# Patient Record
Sex: Male | Born: 1955 | Race: White | Hispanic: No | Marital: Married | Smoking: Never smoker
Health system: Southern US, Community
[De-identification: ages and names within clinical notes are randomized; demographics above are authoritative.]

## PROBLEM LIST (undated history)

## (undated) DIAGNOSIS — Z87442 Personal history of urinary calculi: Secondary | ICD-10-CM

## (undated) DIAGNOSIS — M1990 Unspecified osteoarthritis, unspecified site: Secondary | ICD-10-CM

## (undated) DIAGNOSIS — G4730 Sleep apnea, unspecified: Secondary | ICD-10-CM

## (undated) DIAGNOSIS — I499 Cardiac arrhythmia, unspecified: Secondary | ICD-10-CM

## (undated) DIAGNOSIS — C801 Malignant (primary) neoplasm, unspecified: Secondary | ICD-10-CM

## (undated) DIAGNOSIS — R202 Paresthesia of skin: Secondary | ICD-10-CM

## (undated) DIAGNOSIS — R253 Fasciculation: Secondary | ICD-10-CM

## (undated) DIAGNOSIS — I10 Essential (primary) hypertension: Secondary | ICD-10-CM

## (undated) HISTORY — PX: CARDIAC CATHETERIZATION: SHX172

## (undated) HISTORY — DX: Paresthesia of skin: R20.2

## (undated) HISTORY — PX: LUMBAR LAMINECTOMY: SHX95

## (undated) HISTORY — PX: COLONOSCOPY: SHX174

## (undated) HISTORY — PX: HERNIA REPAIR: SHX51

## (undated) HISTORY — PX: NASAL SINUS SURGERY: SHX719

## (undated) HISTORY — DX: Fasciculation: R25.3

---

## 2018-10-14 ENCOUNTER — Other Ambulatory Visit: Payer: Self-pay | Admitting: Neurological Surgery

## 2018-10-27 NOTE — Pre-Procedure Instructions (Signed)
Lamare Antes Mckinnon  10/27/2018    Your procedure is scheduled on Friday, October 31, 2018 at 10:30 AM.   Report to [facility] Entrance "A" Admitting Office at 8:30 AM.   Call this number if you have problems the morning of surgery: [phone]   Questions prior to day of surgery, please call [phone] between 8 & 4 PM.   Remember:  Do not eat or drink after midnight Thursday, 10/30/18.  Take these medicines the morning of surgery with A SIP OF WATER: Amlodipine (Norvasc), Atorvastatin (Lipitor)  Stop Aspirin as instructed by cardiologist/surgeon. Stop Fish Oil, Co Q 10 and Glucosamine as of today prior to surgeryl    Do not wear jewelry.  Do not wear lotions, powders, cologne or deodorant.  Men may shave face and neck.  Do not bring valuables to the hospital.  [facility] is not responsible for any belongings or valuables.  Contacts, dentures or bridgework may not be worn into surgery.  Leave your suitcase in the car.  After surgery it may be brought to your room.  For patients admitted to the hospital, discharge time will be determined by your treatment team.  [facility] - Preparing for Surgery  Before surgery, you can play an important role.  Because skin is not sterile, your skin needs to be as free of germs as possible.  You can reduce the number of germs on you skin by washing with CHG (chlorahexidine gluconate) soap before surgery.  CHG is an antiseptic cleaner which kills germs and bonds with the skin to continue killing germs even after washing.  Oral Hygiene is also important in reducing the risk of infection.  Remember to brush your teeth with your regular toothpaste the morning of surgery.  Please DO NOT use if you have an allergy to CHG or antibacterial soaps.  If your skin becomes reddened/irritated stop using the CHG and inform your nurse when you arrive at Short Stay.  Do not shave (including legs and underarms) for at least 48 hours prior to the first  CHG shower.  You may shave your face.  Please follow these instructions carefully:   1.  Shower with CHG Soap the night before surgery and the morning of Surgery.  2.  If you choose to wash your hair, wash your hair first as usual with your normal shampoo.  3.  After you shampoo, rinse your hair and body thoroughly to remove the shampoo. 4.  Use CHG as you would any other liquid soap.  You can apply chg directly to the skin and wash gently with a      scrungie or washcloth.           5.  Apply the CHG Soap to your body ONLY FROM THE NECK DOWN.   Do not use on open wounds or open sores. Avoid contact with your eyes, ears, mouth and genitals (private parts).  Wash genitals (private parts) with your normal soap - do this prior to using CHG soap.  6.  Wash thoroughly, paying special attention to the area where your surgery will be performed.  7.  Thoroughly rinse your body with warm water from the neck down.  8.  DO NOT shower/wash with your normal soap after using and rinsing off the CHG Soap.  9.  Pat yourself dry with a clean towel.            10.  Wear clean pajamas.  11.  Place clean sheets on your bed the night of your first shower and do not sleep with pets.  Day of Surgery  Shower as above. Do not apply any lotions/deodorants the morning of surgery.   Please wear clean clothes to the hospital. Remember to brush your teeth with toothpaste.   Please read over the fact sheets that you were given.

## 2018-10-28 ENCOUNTER — Other Ambulatory Visit: Payer: Self-pay

## 2018-10-28 ENCOUNTER — Encounter (HOSPITAL_COMMUNITY): Payer: Self-pay

## 2018-10-28 ENCOUNTER — Encounter (HOSPITAL_COMMUNITY)
Admission: RE | Admit: 2018-10-28 | Discharge: 2018-10-28 | Disposition: A | Discharge status: Home / Self Care | Payer: BC Managed Care – PPO | Source: Ambulatory Visit | Attending: Neurological Surgery | Admitting: Neurological Surgery

## 2018-10-28 ENCOUNTER — Other Ambulatory Visit (HOSPITAL_COMMUNITY)
Admission: RE | Admit: 2018-10-28 | Discharge: 2018-10-28 | Disposition: A | Discharge status: Home / Self Care | Payer: BC Managed Care – PPO | Source: Ambulatory Visit | Attending: Neurological Surgery | Admitting: Neurological Surgery

## 2018-10-28 ENCOUNTER — Ambulatory Visit (HOSPITAL_COMMUNITY)
Admission: RE | Admit: 2018-10-28 | Discharge: 2018-10-28 | Disposition: A | Discharge status: Home / Self Care | Payer: BC Managed Care – PPO | Source: Ambulatory Visit | Attending: Neurological Surgery | Admitting: Neurological Surgery

## 2018-10-28 DIAGNOSIS — M4316 Spondylolisthesis, lumbar region: Secondary | ICD-10-CM | POA: Insufficient documentation

## 2018-10-28 DIAGNOSIS — Z20828 Contact with and (suspected) exposure to other viral communicable diseases: Secondary | ICD-10-CM | POA: Insufficient documentation

## 2018-10-28 DIAGNOSIS — M4310 Spondylolisthesis, site unspecified: Secondary | ICD-10-CM

## 2018-10-28 DIAGNOSIS — Z01818 Encounter for other preprocedural examination: Secondary | ICD-10-CM | POA: Diagnosis not present

## 2018-10-28 HISTORY — DX: Unspecified osteoarthritis, unspecified site: M19.90

## 2018-10-28 HISTORY — DX: Malignant (primary) neoplasm, unspecified: C80.1

## 2018-10-28 HISTORY — DX: Essential (primary) hypertension: I10

## 2018-10-28 HISTORY — DX: Cardiac arrhythmia, unspecified: I49.9

## 2018-10-28 HISTORY — DX: Sleep apnea, unspecified: G47.30

## 2018-10-28 HISTORY — DX: Personal history of urinary calculi: Z87.442

## 2018-10-28 LAB — TYPE AND SCREEN
ABO/RH(D): O NEG
Antibody Screen: NEGATIVE

## 2018-10-28 LAB — BASIC METABOLIC PANEL
Anion gap: 10 (ref 5–15)
BUN: 9 mg/dL (ref 8–23)
CO2: 25 mmol/L (ref 22–32)
Calcium: 9.5 mg/dL (ref 8.9–10.3)
Chloride: 104 mmol/L (ref 98–111)
Creatinine, Ser: 1.15 mg/dL (ref 0.61–1.24)
GFR calc Af Amer: >60 mL/min (ref >60)
GFR calc non Af Amer: >60 mL/min (ref >60)
Glucose, Bld: 131 mg/dL — ABNORMAL HIGH (ref 70–99)
Potassium: 4.1 mmol/L (ref 3.5–5.1)
Sodium: 139 mmol/L (ref 135–145)

## 2018-10-28 LAB — CBC WITH DIFFERENTIAL/PLATELET
Abs Immature Granulocytes: 0.02 10*3/uL (ref 0.00–0.07)
Basophils Absolute: 0 10*3/uL (ref 0.0–0.1)
Basophils Relative: 0 %
Eosinophils Absolute: 0 10*3/uL (ref 0.0–0.5)
Eosinophils Relative: 0 %
HCT: 49.3 % (ref 39.0–52.0)
Hemoglobin: 17.4 g/dL — ABNORMAL HIGH (ref 13.0–17.0)
Immature Granulocytes: 0 %
Lymphocytes Relative: 16 %
Lymphs Abs: 1.3 10*3/uL (ref 0.7–4.0)
MCH: 31.1 pg (ref 26.0–34.0)
MCHC: 35.3 g/dL (ref 30.0–36.0)
MCV: 88.2 fL (ref 80.0–100.0)
Monocytes Absolute: 0.3 10*3/uL (ref 0.1–1.0)
Monocytes Relative: 4 %
Neutro Abs: 6.3 10*3/uL (ref 1.7–7.7)
Neutrophils Relative %: 80 %
Platelets: 208 10*3/uL (ref 150–400)
RBC: 5.59 MIL/uL (ref 4.22–5.81)
RDW: 12.1 % (ref 11.5–15.5)
WBC: 8 10*3/uL (ref 4.0–10.5)
nRBC: 0 % (ref 0.0–0.2)

## 2018-10-28 LAB — PROTIME-INR
INR: 1.1 (ref 0.8–1.2)
Prothrombin Time: 13.9 seconds (ref 11.4–15.2)

## 2018-10-28 LAB — SURGICAL PCR SCREEN
MRSA, PCR: NEGATIVE
Staphylococcus aureus: NEGATIVE

## 2018-10-28 LAB — ABO/RH: ABO/RH(D): O NEG

## 2018-10-28 NOTE — Progress Notes (Signed)
PCP - Dr. Allie Dimmer Cardiologist - Dr. Lorella Nimrod  PPM/ICD - n/a Device Orders -  Rep Notified  Chest x-ray - n/a EKG - requested from [place] - 04/02/18 (Care Everywhere) ECHO -  Cardiac Cath - 04/02/18 (Care Everywhere)  Sleep Study -  CPAP - yes, will bring DOS  Fasting Blood Sugar - n/a Checks Blood Sugar _____ times a day  Blood Thinner Instructions: n/a Aspirin Instructions: last dose 10/21/18  ERAS Protcol - n/a PRE-SURGERY Ensure -   COVID TEST- going today after this appt.   Anesthesia review: yes, to make sure EKG arrives  Patient denies shortness of breath, fever, cough and chest pain at PAT appointment   Patient verbalized understanding of instructions that were given to them at the PAT appointment. Patient was also instructed that they will need to review over the PAT instructions again at home before surgery.  Pt given quarantine instructions and informed of visitation policy and voiced understanding of both.

## 2018-10-29 LAB — NOVEL CORONAVIRUS, NAA (HOSP ORDER, SEND-OUT TO REF LAB; TAT 18-24 HRS): SARS-CoV-2, NAA: NOT DETECTED

## 2018-10-29 NOTE — Anesthesia Preprocedure Evaluation (Addendum)
Anesthesia Evaluation  Patient identified by MRN, date of birth, ID band Patient awake    Reviewed: Allergy & Precautions, NPO status , Patient's Chart, lab work & pertinent test results  Airway Mallampati: I  TM Distance: >3 FB Neck ROM: Full    Dental  (+) Teeth Intact, Dental Advisory Given   Pulmonary sleep apnea and Continuous Positive Airway Pressure Ventilation ,    breath sounds clear to auscultation       Cardiovascular hypertension, Pt. on medications + dysrhythmias  Rhythm:Regular Rate:Normal     Neuro/Psych negative neurological ROS  negative psych ROS   GI/Hepatic Neg liver ROS, GERD  Medicated,  Endo/Other  negative endocrine ROS  Renal/GU negative Renal ROS     Musculoskeletal  (+) Arthritis ,   Abdominal Normal abdominal exam  (+)   Peds  Hematology negative hematology ROS (+)   Anesthesia Other Findings   Reproductive/Obstetrics                           Anesthesia Physical Anesthesia Plan  ASA: II  Anesthesia Plan: General   Post-op Pain Management:    Induction: Intravenous  PONV Risk Score and Plan: 3 and Ondansetron, Dexamethasone and Midazolam  Airway Management Planned: Oral ETT  Additional Equipment: None  Intra-op Plan:   Post-operative Plan: Extubation in OR  Informed Consent: I have reviewed the patients History and Physical, chart, labs and discussed the procedure including the risks, benefits and alternatives for the proposed anesthesia with the patient or authorized representative who has indicated his/her understanding and acceptance.     Dental advisory given  Plan Discussed with: CRNA  Anesthesia Plan Comments: (Follows with cardiology at [facility] clinic for hx of Mobitz I block and management of HTN, HLD, OSA. He had an abnormal stress echo 3/20 and subsequently underwent cath showing only mild diffuse CAD. He also had a cardiac MRI to  evaluate enlarged RV - scan showed showed left and right ventricular systolic function are within normal range. No significant heart valve disease. He was seen 09/29/18 for preop clearance. Per note "Preoperative exam for non cardiac (upcoming back) surgery: May proceed with surgical procedure without further cardiac testing, benefits out weigh risks. No active major cardiac disease (recent MI, decompensated CHF, uncontrolled arrrhythmias, or severe valvular disease). Continue current cardiovascular medication, and per-operative monitoring.  EKG 04/01/18 (care everywhere): Sinus rhythm with 1st degree A-V block. Rate 64.  Tracing requested x2 via fax and phone, not yet received. Will need EKG DOS if not received.   Cardiac MRI 06/20/18 (care everywhere): Impression:  1.Hyperdynamic left ventricular systolic function. Left ventricular ejection fraction is 71%.  2. Normal right ventricular systolic function. Right ventricular EF 52%.  3. There is no late gadolinium enhancement  4. There is noevidence of acute inflammation/edema. No evidence of myocardial iron overload.  5. There is no evidence of intracavitary thrombus  6. Normal coronary artery origin  7. There is no significant pericardial effusion  8. No significant valve disease   Cath 04/02/18 (care everywhere): 1. Diffuse mild atherosclerotic disease of the epicardial coronary  arteries with a very large caliber & diffusely ectatic dominant RCA  2. Normal LVEDP (9 mmHg)  3. Mild gradient across aortic valve (7 mmHg))     Anesthesia Quick Evaluation

## 2018-10-31 ENCOUNTER — Encounter (HOSPITAL_COMMUNITY)
Admission: RE | Disposition: A | Discharge status: Home / Self Care | Payer: Self-pay | Source: Home / Self Care | Attending: Neurological Surgery

## 2018-10-31 ENCOUNTER — Other Ambulatory Visit: Payer: Self-pay

## 2018-10-31 ENCOUNTER — Inpatient Hospital Stay (HOSPITAL_COMMUNITY): Payer: BC Managed Care – PPO

## 2018-10-31 ENCOUNTER — Inpatient Hospital Stay (HOSPITAL_COMMUNITY)
Admission: RE | Admit: 2018-10-31 | Discharge: 2018-11-01 | DRG: 454 | Disposition: A | Discharge status: Home / Self Care | Payer: BC Managed Care – PPO | Attending: Neurological Surgery | Admitting: Neurological Surgery

## 2018-10-31 ENCOUNTER — Inpatient Hospital Stay (HOSPITAL_COMMUNITY): Payer: BC Managed Care – PPO | Admitting: Certified Registered Nurse Anesthetist

## 2018-10-31 ENCOUNTER — Inpatient Hospital Stay (HOSPITAL_COMMUNITY): Payer: BC Managed Care – PPO | Admitting: Physician Assistant

## 2018-10-31 ENCOUNTER — Encounter (HOSPITAL_COMMUNITY): Payer: Self-pay | Admitting: *Deleted

## 2018-10-31 DIAGNOSIS — M4316 Spondylolisthesis, lumbar region: Secondary | ICD-10-CM | POA: Diagnosis present

## 2018-10-31 DIAGNOSIS — I10 Essential (primary) hypertension: Secondary | ICD-10-CM | POA: Diagnosis present

## 2018-10-31 DIAGNOSIS — M48061 Spinal stenosis, lumbar region without neurogenic claudication: Secondary | ICD-10-CM | POA: Diagnosis present

## 2018-10-31 DIAGNOSIS — Y838 Other surgical procedures as the cause of abnormal reaction of the patient, or of later complication, without mention of misadventure at the time of the procedure: Secondary | ICD-10-CM | POA: Diagnosis not present

## 2018-10-31 DIAGNOSIS — Z85828 Personal history of other malignant neoplasm of skin: Secondary | ICD-10-CM | POA: Diagnosis not present

## 2018-10-31 DIAGNOSIS — M1990 Unspecified osteoarthritis, unspecified site: Secondary | ICD-10-CM | POA: Diagnosis present

## 2018-10-31 DIAGNOSIS — Z7982 Long term (current) use of aspirin: Secondary | ICD-10-CM | POA: Diagnosis not present

## 2018-10-31 DIAGNOSIS — G9741 Accidental puncture or laceration of dura during a procedure: Secondary | ICD-10-CM | POA: Diagnosis not present

## 2018-10-31 DIAGNOSIS — K219 Gastro-esophageal reflux disease without esophagitis: Secondary | ICD-10-CM | POA: Diagnosis present

## 2018-10-31 DIAGNOSIS — G4730 Sleep apnea, unspecified: Secondary | ICD-10-CM | POA: Diagnosis present

## 2018-10-31 DIAGNOSIS — Z981 Arthrodesis status: Secondary | ICD-10-CM

## 2018-10-31 DIAGNOSIS — Z419 Encounter for procedure for purposes other than remedying health state, unspecified: Secondary | ICD-10-CM

## 2018-10-31 HISTORY — PX: LUMBAR LAMINECTOMY/DECOMPRESSION MICRODISCECTOMY: SHX5026

## 2018-10-31 SURGERY — POSTERIOR LUMBAR FUSION 1 LEVEL
Anesthesia: General | Site: Back

## 2018-10-31 MED ORDER — SODIUM CHLORIDE (PF) 0.9 % IJ SOLN
INTRAMUSCULAR | Status: DC | PRN
  Administered 2018-10-31: 5 mL

## 2018-10-31 MED ORDER — ACETAMINOPHEN 10 MG/ML IV SOLN
INTRAVENOUS | Status: AC
  Filled 2018-10-31: qty 100

## 2018-10-31 MED ORDER — MIDAZOLAM HCL 2 MG/2ML IJ SOLN
INTRAMUSCULAR | Status: AC
  Filled 2018-10-31: qty 2

## 2018-10-31 MED ORDER — THROMBIN 20000 UNITS EX SOLR
CUTANEOUS | Status: DC | PRN
  Administered 2018-10-31: 20 mL

## 2018-10-31 MED ORDER — CHLORHEXIDINE GLUCONATE CLOTH 2 % EX PADS: 6.0000 | MEDICATED_PAD | Freq: Once | CUTANEOUS | Status: DC

## 2018-10-31 MED ORDER — CEFAZOLIN SODIUM-DEXTROSE 2-4 GM/100ML-% IV SOLN
2.0000 g | INTRAVENOUS | Status: AC
  Administered 2018-10-31: 11:00:00 2 g via INTRAVENOUS

## 2018-10-31 MED ORDER — FENTANYL CITRATE (PF) 250 MCG/5ML IJ SOLN
INTRAMUSCULAR | Status: AC
  Filled 2018-10-31: qty 5

## 2018-10-31 MED ORDER — GLYCOPYRROLATE 0.2 MG/ML IJ SOLN
INTRAMUSCULAR | Status: DC | PRN
  Administered 2018-10-31: 0.2 mg via INTRAVENOUS

## 2018-10-31 MED ORDER — KETOROLAC TROMETHAMINE 30 MG/ML IJ SOLN
INTRAMUSCULAR | Status: AC
  Filled 2018-10-31: qty 1

## 2018-10-31 MED ORDER — METHOCARBAMOL 500 MG PO TABS: 500.0000 mg | ORAL_TABLET | Freq: Four times a day (QID) | ORAL | Status: DC | PRN

## 2018-10-31 MED ORDER — ONDANSETRON HCL 4 MG PO TABS: 4.0000 mg | ORAL_TABLET | Freq: Four times a day (QID) | ORAL | Status: DC | PRN

## 2018-10-31 MED ORDER — OXYCODONE HCL 5 MG PO TABS
10.0000 mg | ORAL_TABLET | ORAL | Status: DC | PRN
  Administered 2018-10-31: 10 mg via ORAL
  Filled 2018-10-31 (×2): qty 2

## 2018-10-31 MED ORDER — PROPOFOL 500 MG/50ML IV EMUL
INTRAVENOUS | Status: DC | PRN
  Administered 2018-10-31: 50 ug/kg/min via INTRAVENOUS

## 2018-10-31 MED ORDER — HYDROMORPHONE HCL 1 MG/ML IJ SOLN
0.2500 mg | INTRAMUSCULAR | Status: DC | PRN
  Administered 2018-10-31 (×2): 0.5 mg via INTRAVENOUS

## 2018-10-31 MED ORDER — MORPHINE SULFATE (PF) 2 MG/ML IV SOLN: 2.0000 mg | INTRAVENOUS | Status: DC | PRN

## 2018-10-31 MED ORDER — LACTATED RINGERS IV SOLN: INTRAVENOUS | Status: DC

## 2018-10-31 MED ORDER — SODIUM CHLORIDE 0.9 % IV SOLN
INTRAVENOUS | Status: DC | PRN
  Administered 2018-10-31: 25 ug/min via INTRAVENOUS

## 2018-10-31 MED ORDER — LACTATED RINGERS IV SOLN
INTRAVENOUS | Status: DC
  Administered 2018-10-31 (×2): via INTRAVENOUS

## 2018-10-31 MED ORDER — ROCURONIUM BROMIDE 10 MG/ML (PF) SYRINGE
PREFILLED_SYRINGE | INTRAVENOUS | Status: AC
  Filled 2018-10-31: qty 20

## 2018-10-31 MED ORDER — DEXAMETHASONE SODIUM PHOSPHATE 10 MG/ML IJ SOLN
INTRAMUSCULAR | Status: AC
  Filled 2018-10-31: qty 2

## 2018-10-31 MED ORDER — LIDOCAINE 2% (20 MG/ML) 5 ML SYRINGE
INTRAMUSCULAR | Status: DC | PRN
  Administered 2018-10-31: 50 mg via INTRAVENOUS

## 2018-10-31 MED ORDER — ACETAMINOPHEN 160 MG/5ML PO SOLN: 325.0000 mg | Freq: Once | ORAL | Status: DC | PRN

## 2018-10-31 MED ORDER — PROPOFOL 10 MG/ML IV BOLUS
INTRAVENOUS | Status: AC
  Filled 2018-10-31: qty 20

## 2018-10-31 MED ORDER — ONDANSETRON HCL 4 MG/2ML IJ SOLN
INTRAMUSCULAR | Status: AC
  Filled 2018-10-31: qty 4

## 2018-10-31 MED ORDER — MENTHOL 3 MG MT LOZG: 1.0000 | LOZENGE | OROMUCOSAL | Status: DC | PRN

## 2018-10-31 MED ORDER — CEFAZOLIN SODIUM-DEXTROSE 2-4 GM/100ML-% IV SOLN
INTRAVENOUS | Status: AC
  Filled 2018-10-31: qty 100

## 2018-10-31 MED ORDER — THROMBIN 5000 UNITS EX SOLR
CUTANEOUS | Status: AC
  Filled 2018-10-31: qty 5000

## 2018-10-31 MED ORDER — ONDANSETRON HCL 4 MG/2ML IJ SOLN: 4.0000 mg | Freq: Four times a day (QID) | INTRAMUSCULAR | Status: DC | PRN

## 2018-10-31 MED ORDER — MEPERIDINE HCL 25 MG/ML IJ SOLN: 6.2500 mg | INTRAMUSCULAR | Status: DC | PRN

## 2018-10-31 MED ORDER — ACETAMINOPHEN 650 MG RE SUPP: 650.0000 mg | RECTAL | Status: DC | PRN

## 2018-10-31 MED ORDER — SENNA 8.6 MG PO TABS
1.0000 | ORAL_TABLET | Freq: Two times a day (BID) | ORAL | Status: DC
  Administered 2018-10-31: 22:00:00 8.6 mg via ORAL
  Filled 2018-10-31 (×2): qty 1

## 2018-10-31 MED ORDER — SODIUM CHLORIDE 0.9% FLUSH
3.0000 mL | Freq: Two times a day (BID) | INTRAVENOUS | Status: DC
  Administered 2018-11-01: 10:00:00 3 mL via INTRAVENOUS

## 2018-10-31 MED ORDER — SODIUM CHLORIDE 0.9 % IV SOLN
INTRAVENOUS | Status: DC | PRN
  Administered 2018-10-31: 12:00:00 500 mL

## 2018-10-31 MED ORDER — HYDROMORPHONE HCL 1 MG/ML IJ SOLN
INTRAMUSCULAR | Status: AC
  Filled 2018-10-31: qty 1

## 2018-10-31 MED ORDER — HEPARIN SODIUM (PORCINE) 1000 UNIT/ML IJ SOLN
INTRAMUSCULAR | Status: DC | PRN
  Administered 2018-10-31: 5000 [IU]

## 2018-10-31 MED ORDER — ACETAMINOPHEN 10 MG/ML IV SOLN: 1000.0000 mg | Freq: Once | INTRAVENOUS | Status: DC | PRN

## 2018-10-31 MED ORDER — ACETAMINOPHEN 10 MG/ML IV SOLN
INTRAVENOUS | Status: DC | PRN
  Administered 2018-10-31: 1000 mg via INTRAVENOUS

## 2018-10-31 MED ORDER — POTASSIUM 99 MG PO TABS: 99.0000 mg | ORAL_TABLET | Freq: Every day | ORAL | Status: DC | PRN

## 2018-10-31 MED ORDER — ARTHREX ANGEL - ACD-A SOLUTION (CHARTING ONLY) OPTIME
TOPICAL | Status: DC | PRN
  Administered 2018-10-31: 12:00:00 10 mL via TOPICAL

## 2018-10-31 MED ORDER — POTASSIUM CHLORIDE IN NACL 20-0.9 MEQ/L-% IV SOLN
INTRAVENOUS | Status: DC
  Administered 2018-10-31: 19:00:00 75 mL/h via INTRAVENOUS
  Filled 2018-10-31: qty 1000

## 2018-10-31 MED ORDER — MIDAZOLAM HCL 2 MG/2ML IJ SOLN
INTRAMUSCULAR | Status: DC | PRN
  Administered 2018-10-31: 2 mg via INTRAVENOUS

## 2018-10-31 MED ORDER — PROMETHAZINE HCL 25 MG/ML IJ SOLN: 6.2500 mg | INTRAMUSCULAR | Status: DC | PRN

## 2018-10-31 MED ORDER — SODIUM CHLORIDE 0.9% FLUSH: 3.0000 mL | INTRAVENOUS | Status: DC | PRN

## 2018-10-31 MED ORDER — SUGAMMADEX SODIUM 200 MG/2ML IV SOLN
INTRAVENOUS | Status: DC | PRN
  Administered 2018-10-31: 200 mg via INTRAVENOUS

## 2018-10-31 MED ORDER — BUPIVACAINE HCL (PF) 0.25 % IJ SOLN
INTRAMUSCULAR | Status: AC
  Filled 2018-10-31: qty 30

## 2018-10-31 MED ORDER — FENTANYL CITRATE (PF) 250 MCG/5ML IJ SOLN
INTRAMUSCULAR | Status: DC | PRN
  Administered 2018-10-31: 100 ug via INTRAVENOUS
  Administered 2018-10-31 (×3): 50 ug via INTRAVENOUS

## 2018-10-31 MED ORDER — DEXAMETHASONE SODIUM PHOSPHATE 4 MG/ML IJ SOLN: 4.0000 mg | Freq: Four times a day (QID) | INTRAMUSCULAR | Status: DC

## 2018-10-31 MED ORDER — METHOCARBAMOL 1000 MG/10ML IJ SOLN
500.0000 mg | Freq: Four times a day (QID) | INTRAVENOUS | Status: DC | PRN
  Filled 2018-10-31: qty 5

## 2018-10-31 MED ORDER — PROPOFOL 10 MG/ML IV BOLUS
INTRAVENOUS | Status: DC | PRN
  Administered 2018-10-31: 140 mg via INTRAVENOUS

## 2018-10-31 MED ORDER — LISINOPRIL 20 MG PO TABS
20.0000 mg | ORAL_TABLET | Freq: Every day | ORAL | Status: DC
  Administered 2018-11-01: 10:00:00 20 mg via ORAL
  Filled 2018-10-31: qty 1

## 2018-10-31 MED ORDER — DEXAMETHASONE 4 MG PO TABS
4.0000 mg | ORAL_TABLET | Freq: Four times a day (QID) | ORAL | Status: DC
  Administered 2018-10-31 – 2018-11-01 (×3): 4 mg via ORAL
  Filled 2018-10-31 (×3): qty 1

## 2018-10-31 MED ORDER — 0.9 % SODIUM CHLORIDE (POUR BTL) OPTIME
TOPICAL | Status: DC | PRN
  Administered 2018-10-31: 1000 mL

## 2018-10-31 MED ORDER — ONDANSETRON HCL 4 MG/2ML IJ SOLN
INTRAMUSCULAR | Status: DC | PRN
  Administered 2018-10-31: 4 mg via INTRAVENOUS

## 2018-10-31 MED ORDER — ACETAMINOPHEN 325 MG PO TABS
650.0000 mg | ORAL_TABLET | ORAL | Status: DC | PRN
  Administered 2018-10-31 – 2018-11-01 (×2): 650 mg via ORAL
  Filled 2018-10-31 (×2): qty 2

## 2018-10-31 MED ORDER — ASPIRIN EC 81 MG PO TBEC
81.0000 mg | DELAYED_RELEASE_TABLET | Freq: Every day | ORAL | Status: DC
  Administered 2018-11-01: 81 mg via ORAL
  Filled 2018-10-31: qty 1

## 2018-10-31 MED ORDER — AMLODIPINE BESYLATE 5 MG PO TABS
5.0000 mg | ORAL_TABLET | Freq: Every day | ORAL | Status: DC
  Filled 2018-10-31: qty 1

## 2018-10-31 MED ORDER — BUPIVACAINE HCL (PF) 0.25 % IJ SOLN
INTRAMUSCULAR | Status: DC | PRN
  Administered 2018-10-31: 4 mL
  Administered 2018-10-31: 20 mL

## 2018-10-31 MED ORDER — HEPARIN SODIUM (PORCINE) 1000 UNIT/ML IJ SOLN
INTRAMUSCULAR | Status: AC
  Filled 2018-10-31: qty 1

## 2018-10-31 MED ORDER — SODIUM CHLORIDE 0.9 % IV SOLN: 250.0000 mL | INTRAVENOUS | Status: DC

## 2018-10-31 MED ORDER — DEXAMETHASONE SODIUM PHOSPHATE 10 MG/ML IJ SOLN
INTRAMUSCULAR | Status: DC | PRN
  Administered 2018-10-31: 10 mg via INTRAVENOUS

## 2018-10-31 MED ORDER — LIDOCAINE 2% (20 MG/ML) 5 ML SYRINGE
INTRAMUSCULAR | Status: AC
  Filled 2018-10-31: qty 10

## 2018-10-31 MED ORDER — CEFAZOLIN SODIUM-DEXTROSE 2-4 GM/100ML-% IV SOLN
2.0000 g | Freq: Three times a day (TID) | INTRAVENOUS | Status: AC
  Administered 2018-10-31 – 2018-11-01 (×2): 2 g via INTRAVENOUS
  Filled 2018-10-31 (×2): qty 100

## 2018-10-31 MED ORDER — PHENOL 1.4 % MT LIQD: 1.0000 | OROMUCOSAL | Status: DC | PRN

## 2018-10-31 MED ORDER — CELECOXIB 200 MG PO CAPS
200.0000 mg | ORAL_CAPSULE | Freq: Two times a day (BID) | ORAL | Status: DC
  Administered 2018-10-31 – 2018-11-01 (×2): 200 mg via ORAL
  Filled 2018-10-31 (×3): qty 1

## 2018-10-31 MED ORDER — THROMBIN 20000 UNITS EX SOLR
CUTANEOUS | Status: AC
  Filled 2018-10-31: qty 20000

## 2018-10-31 MED ORDER — THROMBIN 5000 UNITS EX SOLR
OROMUCOSAL | Status: DC | PRN
  Administered 2018-10-31: 5 mL

## 2018-10-31 MED ORDER — ROCURONIUM BROMIDE 10 MG/ML (PF) SYRINGE
PREFILLED_SYRINGE | INTRAVENOUS | Status: DC | PRN
  Administered 2018-10-31: 30 mg via INTRAVENOUS

## 2018-10-31 MED ORDER — ACETAMINOPHEN 325 MG PO TABS: 325.0000 mg | ORAL_TABLET | Freq: Once | ORAL | Status: DC | PRN

## 2018-10-31 MED ORDER — SUCCINYLCHOLINE CHLORIDE 200 MG/10ML IV SOSY
PREFILLED_SYRINGE | INTRAVENOUS | Status: DC | PRN
  Administered 2018-10-31: 120 mg via INTRAVENOUS

## 2018-10-31 SURGICAL SUPPLY — 77 items
BAG DECANTER FOR FLEXI CONT (MISCELLANEOUS) ×4 IMPLANT
BAND RUBBER #18 3X1/16 STRL (MISCELLANEOUS) ×2 IMPLANT
BASKET BONE COLLECTION (BASKET) ×3 IMPLANT
BENZOIN TINCTURE PRP APPL 2/3 (GAUZE/BANDAGES/DRESSINGS) ×4 IMPLANT
BLADE CLIPPER SURG (BLADE) IMPLANT
BUR MATCHSTICK NEURO 3.0 LAGG (BURR) ×4 IMPLANT
CANISTER SUCT 3000ML PPV (MISCELLANEOUS) ×4 IMPLANT
CARTRIDGE OIL MAESTRO DRILL (MISCELLANEOUS) ×2 IMPLANT
CLIP SPRING STIM LLIF SAFEOP (CLIP) ×2 IMPLANT
CLOSURE WOUND 1/2 X4 (GAUZE/BANDAGES/DRESSINGS) ×1
CONT SPEC 4OZ CLIKSEAL STRL BL (MISCELLANEOUS) ×3 IMPLANT
COVER BACK TABLE 60X90IN (DRAPES) ×3 IMPLANT
COVER WAND RF STERILE (DRAPES) ×6 IMPLANT
DERMABOND ADVANCED (GAUZE/BANDAGES/DRESSINGS) ×2
DERMABOND ADVANCED .7 DNX12 (GAUZE/BANDAGES/DRESSINGS) ×1 IMPLANT
DIFFUSER DRILL AIR PNEUMATIC (MISCELLANEOUS) ×4 IMPLANT
DRAPE C-ARM 42X72 X-RAY (DRAPES) ×4 IMPLANT
DRAPE LAPAROTOMY 100X72X124 (DRAPES) ×4 IMPLANT
DRAPE MICROSCOPE LEICA (MISCELLANEOUS) ×1 IMPLANT
DRAPE SURG 17X23 STRL (DRAPES) ×2 IMPLANT
DRSG OPSITE POSTOP 4X6 (GAUZE/BANDAGES/DRESSINGS) ×2 IMPLANT
DURAPREP 26ML APPLICATOR (WOUND CARE) ×4 IMPLANT
ELECT KIT SAFEOP EMG/NMJ (ELECTRODE) ×2
ELECT REM PT RETURN 9FT ADLT (ELECTROSURGICAL) ×3
ELECTRODE REM PT RTRN 9FT ADLT (ELECTROSURGICAL) ×2 IMPLANT
EVACUATOR 1/8 PVC DRAIN (DRAIN) ×3 IMPLANT
GAUZE 4X4 16PLY RFD (DISPOSABLE) IMPLANT
GLOVE BIO SURGEON STRL SZ7 (GLOVE) ×2 IMPLANT
GLOVE BIO SURGEON STRL SZ8 (GLOVE) ×5 IMPLANT
GLOVE BIOGEL PI IND STRL 7.0 (GLOVE) IMPLANT
GLOVE BIOGEL PI IND STRL 7.5 (GLOVE) IMPLANT
GLOVE BIOGEL PI INDICATOR 7.0 (GLOVE) ×2
GLOVE BIOGEL PI INDICATOR 7.5 (GLOVE) ×6
GLOVE SURG SS PI 7.0 STRL IVOR (GLOVE) ×6 IMPLANT
GOWN STRL REUS W/ TWL LRG LVL3 (GOWN DISPOSABLE) IMPLANT
GOWN STRL REUS W/ TWL XL LVL3 (GOWN DISPOSABLE) ×3 IMPLANT
GOWN STRL REUS W/TWL 2XL LVL3 (GOWN DISPOSABLE) IMPLANT
GOWN STRL REUS W/TWL LRG LVL3 (GOWN DISPOSABLE) ×6
GOWN STRL REUS W/TWL XL LVL3 (GOWN DISPOSABLE) ×6
HEMOSTAT POWDER KIT SURGIFOAM (HEMOSTASIS) ×2 IMPLANT
KIT BASIN OR (CUSTOM PROCEDURE TRAY) ×4 IMPLANT
KIT BONE MRW ASP ANGEL CPRP (KITS) ×2 IMPLANT
KIT EMG SRFC ELECT SAFEOP (ELECTRODE) IMPLANT
KIT TURNOVER KIT B (KITS) ×4 IMPLANT
MILL MEDIUM DISP (BLADE) ×1 IMPLANT
NDL HYPO 18GX1.5 BLUNT FILL (NEEDLE) IMPLANT
NDL HYPO 25X1 1.5 SAFETY (NEEDLE) ×2 IMPLANT
NDL SPNL 20GX3.5 QUINCKE YW (NEEDLE) IMPLANT
NEEDLE HYPO 18GX1.5 BLUNT FILL (NEEDLE) ×6 IMPLANT
NEEDLE HYPO 25X1 1.5 SAFETY (NEEDLE) ×3 IMPLANT
NEEDLE SPNL 20GX3.5 QUINCKE YW (NEEDLE) IMPLANT
NS IRRIG 1000ML POUR BTL (IV SOLUTION) ×4 IMPLANT
OIL CARTRIDGE MAESTRO DRILL (MISCELLANEOUS) ×3
PACK LAMINECTOMY NEURO (CUSTOM PROCEDURE TRAY) ×4 IMPLANT
PAD ARMBOARD 7.5X6 YLW CONV (MISCELLANEOUS) ×6 IMPLANT
PUTTY DBM ALLOSYNC PURE 10CC (Putty) ×2 IMPLANT
ROD LORD LIP TI 5.5X30 (Rod) ×4 IMPLANT
SCREW CANC SHANK MOD 6.5X45 (Screw) ×8 IMPLANT
SCREW POLYAXIAL TULIP (Screw) ×8 IMPLANT
SEALANT ADHERUS EXTEND TIP (MISCELLANEOUS) ×2 IMPLANT
SET SCREW (Screw) ×8 IMPLANT
SET SCREW SPNE (Screw) IMPLANT
SPACER IDENTI PS 9X9X25 5D (Spacer) ×4 IMPLANT
SPONGE LAP 4X18 RFD (DISPOSABLE) IMPLANT
SPONGE SURGIFOAM ABS GEL 100 (HEMOSTASIS) ×3 IMPLANT
SPONGE SURGIFOAM ABS GEL SZ50 (HEMOSTASIS) IMPLANT
STRIP CLOSURE SKIN 1/2X4 (GAUZE/BANDAGES/DRESSINGS) ×4 IMPLANT
SUT PROLENE 6 0 BV (SUTURE) ×2 IMPLANT
SUT VIC AB 0 CT1 18XCR BRD8 (SUTURE) ×2 IMPLANT
SUT VIC AB 0 CT1 8-18 (SUTURE) ×2
SUT VIC AB 2-0 CP2 18 (SUTURE) ×4 IMPLANT
SUT VIC AB 3-0 SH 8-18 (SUTURE) ×7 IMPLANT
SYR 30ML LL (SYRINGE) ×2 IMPLANT
TOWEL GREEN STERILE (TOWEL DISPOSABLE) ×6 IMPLANT
TOWEL GREEN STERILE FF (TOWEL DISPOSABLE) ×6 IMPLANT
TRAY FOLEY MTR SLVR 16FR STAT (SET/KITS/TRAYS/PACK) ×3 IMPLANT
WATER STERILE IRR 1000ML POUR (IV SOLUTION) ×4 IMPLANT

## 2018-10-31 NOTE — Anesthesia Procedure Notes (Signed)
Procedure Name: Intubation Date/Time: 10/31/2018 10:50 AM Performed by: Clearnce Sorrel, CRNA Pre-anesthesia Checklist: Patient identified, Emergency Drugs available, Suction available, Patient being monitored and Timeout performed Patient Re-evaluated:Patient Re-evaluated prior to induction Oxygen Delivery Method: Circle system utilized Preoxygenation: Pre-oxygenation with 100% oxygen Induction Type: IV induction Laryngoscope Size: Mac and 4 Grade View: Grade I Tube type: Oral Tube size: 7.5 mm Number of attempts: 1 Airway Equipment and Method: Stylet Placement Confirmation: ETT inserted through vocal cords under direct vision,  positive ETCO2 and breath sounds checked- equal and bilateral Secured at: 23 cm Tube secured with: Tape Dental Injury: Teeth and Oropharynx as per pre-operative assessment

## 2018-10-31 NOTE — Transfer of Care (Signed)
Immediate Anesthesia Transfer of Care Note  Patient: Juan Sampson  Procedure(s) Performed: Posterior Lumbar Interbody Fusion - Lumbar Four-Lumbar Five (N/A Back) Decompressive laminectomy Lumbar Three-Lumbar Four (N/A Back)  Patient Location: PACU  Anesthesia Type:General  Level of Consciousness: awake  Airway & Oxygen Therapy: Patient Spontanous Breathing and Patient connected to face mask oxygen  Post-op Assessment: Report given to RN and Post -op Vital signs reviewed and stable  Post vital signs: Reviewed and stable  Last Vitals:  Vitals Value Taken Time  BP 107/58 10/31/18 1447  Temp    Pulse 69 10/31/18 1449  Resp 21 10/31/18 1448  SpO2 100 % 10/31/18 1449  Vitals shown include unvalidated device data.  Last Pain:  Vitals:   10/31/18 0840  TempSrc: Oral         Complications: No apparent anesthesia complications

## 2018-10-31 NOTE — Anesthesia Postprocedure Evaluation (Signed)
Anesthesia Post Note  Patient: Juan Sampson  Procedure(s) Performed: Posterior Lumbar Interbody Fusion - Lumbar Four-Lumbar Five (N/A Back) Decompressive laminectomy Lumbar Three-Lumbar Four (N/A Back)     Patient location during evaluation: PACU Anesthesia Type: General Level of consciousness: awake and alert Pain management: pain level controlled Vital Signs Assessment: post-procedure vital signs reviewed and stable Respiratory status: spontaneous breathing, nonlabored ventilation, respiratory function stable and patient connected to nasal cannula oxygen Cardiovascular status: blood pressure returned to baseline and stable Postop Assessment: no apparent nausea or vomiting Anesthetic complications: no    Last Vitals:  Vitals:   10/31/18 1700 10/31/18 1732  BP: (!) 148/72 138/71  Pulse: 63 65  Resp: 20 16  Temp:  36.7 C  SpO2: 100% 98%    Last Pain:  Vitals:   10/31/18 1732  TempSrc: Oral  PainSc:                  Effie Berkshire

## 2018-10-31 NOTE — H&P (Signed)
Subjective: Patient is a 63 y.o. male admitted for PLIF. Onset of symptoms was several months ago, gradually worsening since that time.  The pain is rated severe, and is located at the across the lower back and radiates to legs. The pain is described as aching and occurs all day. The symptoms have been progressive. Symptoms are exacerbated by exercise. MRI or CT showed spondylolisthesis L4-5 and stenosis L3-4   Past Medical History:  Diagnosis Date  . Arthritis   . Cancer ([place])    basal cell skin cancer  . Dysrhythmia    skips a beat  . History of kidney stones   . Hypertension   . Sleep apnea    uses cpap    Past Surgical History:  Procedure Laterality Date  . CARDIAC CATHETERIZATION    . COLONOSCOPY    . HERNIA REPAIR     umbilical  . LUMBAR LAMINECTOMY    . NASAL SINUS SURGERY      Prior to Admission medications   Medication Sig Start Date End Date Taking? Authorizing Provider  amLODipine (NORVASC) 5 MG tablet Take 5 mg by mouth daily.   Yes [provider]  aspirin EC 81 MG tablet Take 81 mg by mouth daily.   Yes [provider]  atorvastatin (LIPITOR) 20 MG tablet Take 20 mg by mouth daily.   Yes [provider]  Coenzyme Q10 (COQ10) 100 MG CAPS Take 100 mg by mouth daily.   Yes [provider]  Lactobacillus-Inulin (PROBIOTIC DIGESTIVE SUPPORT PO) Take 1 capsule by mouth daily.   Yes [provider]  lisinopril (ZESTRIL) 20 MG tablet Take 20 mg by mouth daily.   Yes [provider]  Misc Natural Products (GLUCOSAMINE CHOND COMPLEX/MSM PO) Take 1-2 tablets by mouth daily.   Yes [provider]  Omega-3 Fatty Acids (FISH OIL) 1000 MG CAPS Take 1,000 mg by mouth daily.   Yes [provider]  omeprazole (PRILOSEC) 20 MG capsule Take 20 mg by mouth daily.   Yes [provider]  Potassium 99 MG TABS Take 99 mg by mouth daily as needed (cramping).   Yes [provider]   No Known Allergies   Social History   Tobacco Use  . Smoking status: Never Smoker  . Smokeless tobacco: Never Used  Substance Use Topics  . Alcohol use: Yes    Comment: rare    History reviewed. No pertinent family history.   Review of Systems  Positive ROS: neg  All other systems have been reviewed and were otherwise negative with the exception of those mentioned in the HPI and as above.  Objective: Vital signs in last 24 hours: Temp:  [98.5 F (36.9 C)] 98.5 F (36.9 C) (10/02 0840) Pulse Rate:  [96] 96 (10/02 0840) Resp:  [20] 20 (10/02 0840) BP: (171)/(79) 171/79 (10/02 0840) SpO2:  [98 %] 98 % (10/02 0840) Weight:  [97.1 kg] 97.1 kg (10/02 0840)  General Appearance: Alert, cooperative, no distress, appears stated age Head: Normocephalic, without obvious abnormality, atraumatic Eyes: PERRL, conjunctiva/corneas clear, EOM's intact    Neck: Supple, symmetrical, trachea midline Back: Symmetric, no curvature, ROM normal, no CVA tenderness Lungs:  respirations unlabored Heart: Regular rate and rhythm Abdomen: Soft, non-tender Extremities: Extremities normal, atraumatic, no cyanosis or edema Pulses: 2+ and symmetric all extremities Skin: Skin color, texture, turgor normal, no rashes or lesions  NEUROLOGIC:   Mental status: Alert and oriented x4,  no aphasia, good attention span, fund of knowledge, and  memory Motor Exam - grossly normal Sensory Exam - grossly normal Reflexes: 1+ Coordination - grossly normal Gait - grossly normal Balance - grossly normal Cranial Nerves: I: smell Not tested  II: visual acuity  OS: nl    OD: nl  II: visual fields Full to confrontation  II: pupils Equal, round, reactive to light  III,VII: ptosis None  III,IV,VI: extraocular muscles  Full ROM  V: mastication Normal  V: facial light touch sensation  Normal  V,VII: corneal reflex  Present  VII: facial muscle function - upper  Normal  VII: facial muscle function - lower Normal  VIII: hearing Not  tested  IX: soft palate elevation  Normal  IX,X: gag reflex Present  XI: trapezius strength  5/5  XI: sternocleidomastoid strength 5/5  XI: neck flexion strength  5/5  XII: tongue strength  Normal    Data Review Lab Results  Component Value Date   WBC 8.0 10/28/2018   HGB 17.4 (H) 10/28/2018   HCT 49.3 10/28/2018   MCV 88.2 10/28/2018   PLT 208 10/28/2018   Lab Results  Component Value Date   NA 139 10/28/2018   K 4.1 10/28/2018   CL 104 10/28/2018   CO2 25 10/28/2018   BUN 9 10/28/2018   CREATININE 1.15 10/28/2018   GLUCOSE 131 (H) 10/28/2018   Lab Results  Component Value Date   INR 1.1 10/28/2018    Assessment/Plan:  Estimated body mass index is 29.86 kg/m as calculated from the following:   Height as of this encounter: 5\' 11"  (1.803 m).   Weight as of this encounter: 97.1 kg. Patient admitted for PLIF L4-5, DLL L3-4. Patient has failed a reasonable attempt at conservative therapy.  I explained the condition and procedure to the patient and answered any questions.  Patient wishes to proceed with procedure as planned. Understands risks/ benefits and typical outcomes of procedure.   Juan Sampson 10/31/2018 10:08 AM

## 2018-10-31 NOTE — Op Note (Signed)
10/31/2018  2:48 PM  PATIENT:  Juan Sampson  63 y.o. 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 Decompressive lumbar laminectomy L4-5 requiring more work than would be required for a simple exposure of the disk for PLIF in order to adequately decompress the neural elements and address the spinal stenosis, with decompressive laminectomy and medial facetectomy and foraminotomies L3-4 (note this level is separate from the plif level) 2. Posterior lumbar interbody fusion L4-5 using porous titanium interbody cages packed with morcellized allograft and autograft soaked with a bone marrow aspirate obtained through a separate fascial incision over the right iliac crest 3. Posterior fixation L45 using a tec cortical pedicle screws.  4. Intertransverse arthrodesis L4-5 using morcellized autograft and allograft.  SURGEON:  Sherley Bounds, MD  ASSISTANTS: Glenford Peers, FNP  ANESTHESIA:  General  EBL: 350 ml  Total I/O In: 1000 [I.V.:1000] Out: 550 [Urine:200; Blood:350]  BLOOD ADMINISTERED:none  DRAINS: none   INDICATION FOR PROCEDURE: This patient presented with severe back and bilateral leg pain. Imaging revealed postlaminectomy spondylolisthesis L4-5 with spinal stenosis L3-4. The patient tried a reasonable attempt at conservative medical measures without relief. I recommended decompression and instrumented fusion to address the stenosis as well as the segmental  instability.  Patient understood the risks, benefits, and alternatives and potential outcomes and wished to proceed.  PROCEDURE DETAILS:  The patient was brought to the operating room. After induction of generalized endotracheal anesthesia the patient was rolled into the prone position on chest rolls and all pressure points were padded. The patient's lumbar region was cleaned and then prepped  with DuraPrep and draped in the usual sterile fashion. Anesthesia was injected and then a dorsal midline incision was made and carried down to the lumbosacral fascia. The fascia was opened and the paraspinous musculature was taken down in a subperiosteal fashion to expose L3-4 and L4-5.  There was no spinous process of L4 and the facets of L4-5 were towering.  A self-retaining retractor was placed. Intraoperative fluoroscopy confirmed my level, and I started with placement of the L4 cortical pedicle screws. The pedicle screw entry zones were identified utilizing surface landmarks and  AP and lateral fluoroscopy. I scored the cortex with the high-speed drill and then used the hand drill to drill an upward and outward direction into the pedicle. I then tapped line to line. I then placed a 2.5 x 40 mm cortical pedicle screw into the pedicles of L4 bilaterally.  I then dissected in a suprafascial plane to expose the iliac crest.  Open the fascia used a Jamshidi needle to extract 60 cc of bone marrow aspirate from the iliac crest.  This was then spun down by [facility] device and 2 to 4 cc of  BMAC was soaked on morselized allograft for later arthrodesis.  I dried the hole with Surgifoam and closed the fascia.  I then turned my attention to the decompression and complete lumbar laminectomies, hemi- facetectomies, and foraminotomies were performed at L4-5.  We also remove the inferior half of the spinous process of L3 and drilled away the inferior part of the lamina of L3 and performed bilateral hemilaminectomies and medial facetectomies here. the patient had significant spinal stenosis and this required more work than would be required for a simple exposure of the disc for posterior lumbar interbody fusion which would only require a limited laminotomy. Much more generous decompression  and generous foraminotomy was undertaken in order to adequately decompress the neural elements and address the patient's leg pain. The yellow  ligament was removed to expose the underlying dura and nerve roots, and generous foraminotomies were performed to adequately decompress the neural elements. Both the exiting and traversing nerve roots were decompressed on both sides until a coronary dilator passed easily along the nerve roots.  At the L4 pedicle level in the midline a tiny unintended durotomy was created, this was repaired with a single 6-0 suture. once the decompression was complete, I turned my attention to the posterior lower lumbar interbody fusion. The epidural venous vasculature was coagulated and cut sharply. Disc space was incised and the initial discectomy was performed with pituitary rongeurs.  The right side of the disc space was calcified and we spent considerable time opening this with an osteotome followed by a Kerrison rongeur.  Once we did this we were able to distract the disc space.  The disc space was distracted with sequential distractors to a height of 9 mm. We then used a series of scrapers and shavers to prepare the endplates for fusion. The midline was prepared with Epstein curettes. Once the complete discectomy was finished, we packed an appropriate sized interbody cage with local autograft and morcellized allograft, gently retracted the nerve root, and tapped the cage into position at L4-5.  The midline between the cages was packed with morselized autograft and allograft. We then turned our attention to the placement of the lower pedicle screws. The pedicle screw entry zones were identified utilizing surface landmarks and fluoroscopy. I drilled into each pedicle utilizing the hand drill, and tapped each pedicle with the appropriate tap. We palpated with a ball probe to assure no break in the cortex. We then placed 6.5 x 45 mm pedicle screws into the pedicles bilaterally at L5. We then decorticated the transverse processes and laid a mixture of morcellized autograft and allograft out over these to perform intertransverse  arthrodesis at L4-5. We then placed lordotic rods into the multiaxial screw heads of the pedicle screws and locked these in position with the locking caps and anti-torque device. We then checked our construct with AP and lateral fluoroscopy. Irrigated with copious amounts of bacitracin-containing saline solution. Inspected the nerve roots once again to assure adequate decompression, lined the dural repair site with muscle, lined to the dura with Gelfoam and Tisseel fibrin glue, and closed the muscle and the fascia with 0 Vicryl. Closed the subcutaneous tissues with 2-0 Vicryl and subcuticular tissues with 3-0 Vicryl. The skin was closed with benzoin and Steri-Strips. Dressing was then applied, the patient was awakened from general anesthesia and transported to the recovery room in stable condition. At the end of the procedure all sponge, needle and instrument counts were correct.   PLAN OF CARE: admit to inpatient  PATIENT DISPOSITION:  PACU - hemodynamically stable.   Delay start of Pharmacological VTE agent (>24hrs) due to surgical blood loss or risk of bleeding:  yes

## 2018-10-31 NOTE — Progress Notes (Addendum)
Orthopedic Tech Progress Note Patient Details:  Juan Sampson [facility] 1955-06-16 [id number] Patient has brace Patient ID: Juan Sampson, male   DOB: Aug 04, 1955, 63 y.o.   MRN: [id number]   Juan Sampson 10/31/2018, 6:42 PM

## 2018-11-01 MED ORDER — CELECOXIB 200 MG PO CAPS: 200.0000 mg | ORAL_CAPSULE | Freq: Two times a day (BID) | ORAL | 0 refills | Status: DC

## 2018-11-01 MED ORDER — METHOCARBAMOL 500 MG PO TABS: 500.0000 mg | ORAL_TABLET | Freq: Four times a day (QID) | ORAL | 1 refills | Status: DC | PRN

## 2018-11-01 MED ORDER — OXYCODONE HCL 10 MG PO TABS: 10.0000 mg | ORAL_TABLET | ORAL | 0 refills | Status: DC | PRN

## 2018-11-01 NOTE — Evaluation (Signed)
Occupational Therapy Evaluation Patient Details Name: Juan Sampson MRN: [id number] DOB: May 31, 1955 Today's Date: 11/01/2018    History of Present Illness Juan Sampson is a 63 y/o male admitted s/p PLIF L4-5, DLL L3-4   Clinical Impression   Pt admitted with above dx, presenting with mild limitations 2/2 post sx pain and precautions. PTA pt fully independent, exercising regularly. At time of eval he is a mod I for bed mobility and functional mobility. Pt able to complete functional mobility without use of RW and maintain balance well. Educated on precautions and their implications with BADL, pt return demo in brushing teeth, dressing, and bed mobility. Pt needs min A to reach feet for dressing, states he is not interested in equipment and wife will assist. No Further OT needs identified, or post acute OT indicated. OT will sign off for one time eval. Thank you for this consult.     Follow Up Recommendations  No OT follow up    Equipment Recommendations  None recommended by OT    Recommendations for Other Services       Precautions / Restrictions Precautions Precautions: None;Back Precaution Booklet Issued: No Precaution Comments: Pt able to recall Required Braces or Orthoses: Spinal Brace Spinal Brace: Lumbar corset;Applied in sitting position Restrictions Weight Bearing Restrictions: No Other Position/Activity Restrictions: ok to remove brace in bed; ok to amb to BR without brace; ok to remove to shower      Mobility Bed Mobility Overal bed mobility: Modified Independent             General bed mobility comments: HOB flat, increased time and cueing for log rolling technique  Transfers Overall transfer level: Modified independent Equipment used: None             General transfer comment: no phsyical assist needed, no LOB    Balance Overall balance assessment: Mild deficits observed, not formally tested                                         ADL either performed or assessed with clinical judgement   ADL Overall ADL's : Needs assistance/impaired Eating/Feeding: Independent   Grooming: Modified independent;Standing;Oral care Grooming Details (indicate cue type and reason): educated on two cup method to prevent bending at sink Upper Body Bathing: Modified independent;Sitting;Standing   Lower Body Bathing: Minimal assistance;Sit to/from stand;Sitting/lateral leans Lower Body Bathing Details (indicate cue type and reason): assist to reach feet, states wife helps Upper Body Dressing : Modified independent   Lower Body Dressing: Minimal assistance;Sit to/from stand;Sitting/lateral leans Lower Body Dressing Details (indicate cue type and reason): assist to reach feet, states wife will help Toilet Transfer: Modified Independent;Regular Toilet   Toileting- Clothing Manipulation and Hygiene: Modified independent Toileting - Clothing Manipulation Details (indicate cue type and reason): edu on not twisting with peri care Tub/ Shower Transfer: Modified independent;Ambulation   Functional mobility during ADLs: Modified independent General ADL Comments: pt mildly lts 2/2 post sx pain and precautions     Vision Baseline Vision/History: No visual deficits Patient Visual Report: No change from baseline Vision Assessment?: No apparent visual deficits     Perception     Praxis      Pertinent Vitals/Pain Pain Assessment: Faces Faces Pain Scale: Hurts a little bit Pain Location: back with mobility Pain Descriptors / Indicators: Aching;Sore Pain Intervention(s): Monitored during session;Repositioned     Hand Dominance  Extremity/Trunk Assessment Upper Extremity Assessment Upper Extremity Assessment: Overall WFL for tasks assessed   Lower Extremity Assessment Lower Extremity Assessment: Defer to PT evaluation   Cervical / Trunk Assessment Cervical / Trunk Assessment: Other exceptions Cervical / Trunk Exceptions:  recent spinal sx   Communication Communication Communication: No difficulties   Cognition Arousal/Alertness: Awake/alert Behavior During Therapy: WFL for tasks assessed/performed Overall Cognitive Status: Within Functional Limits for tasks assessed                                     General Comments       Exercises     Shoulder Instructions      Home Living Family/patient expects to be discharged to:: Private residence Living Arrangements: Spouse/significant other Available Help at Discharge: Family Type of Home: House Home Access: Stairs to enter [employer] of Steps: 1 STE   Home Layout: Two level;Able to live on main level with bedroom/bathroom Alternate Level Stairs-Number of Steps: downstairs is full basement, pt reports he does not need to go down there   Bathroom Shower/Tub: Occupational psychologist: Standard Bathroom Accessibility: Yes How Accessible: Accessible via walker Home Equipment: None          Prior Functioning/Environment Level of Independence: Independent                 OT Problem List: Decreased knowledge of use of DME or AE;Decreased knowledge of precautions;Pain;Impaired balance (sitting and/or standing)      OT Treatment/Interventions:      OT Goals(Current goals can be found in the care plan section) Acute Rehab OT Goals Patient Stated Goal: to go home today OT Goal Formulation: With patient Time For Goal Achievement: 11/15/18 Potential to Achieve Goals: Good  OT Frequency:     Barriers to D/C:            Co-evaluation              AM-PAC OT "6 Clicks" Daily Activity     Outcome Measure Help from another person eating meals?: None Help from another person taking care of personal grooming?: None Help from another person toileting, which includes using toliet, bedpan, or urinal?: None Help from another person bathing (including washing, rinsing, drying)?: A Little Help from another  person to put on and taking off regular upper body clothing?: None Help from another person to put on and taking off regular lower body clothing?: A Little 6 Click Score: 22   End of Session Equipment Utilized During Treatment: Gait belt Nurse Communication: Mobility status  Activity Tolerance: Patient tolerated treatment well Patient left: in chair;with call bell/phone within reach;with chair alarm set  OT Visit Diagnosis: Other abnormalities of gait and mobility (R26.89);Pain Pain - part of body: (back)                Time: [id number] OT Time Calculation (min): 23 min Charges:  OT General Charges $OT Visit: 1 Visit OT Evaluation $OT Eval Low Complexity: 1 Low OT Treatments $Self Care/Home Management : 8-22 mins  Zenovia Jarred, MSOT, OTR/L Behavioral Health OT/ Acute Relief OT [facility] Office: [phone]   Zenovia Jarred 11/01/2018, 9:32 AM

## 2018-11-01 NOTE — TOC Transition Note (Signed)
Transition of Care [facility]) - CM/SW Discharge Note   Patient Details  Name: Juan Sampson MRN: [id number] Date of Birth: 07/16/1955  [facility]) CM/SW Contact:  Apolonio Schneiders, RN Phone Number: 11/01/2018, 10:20 AM   Clinical Narrative:    CM spoke with the patient at the bedside. Patient needs a 3N1. He will have assistance from his wife when discharged home today. Reports he is able to afford his medications and has transportation to his appointments.    Final next level of care: Home/Self Care Barriers to Discharge: No Barriers Identified   Patient Goals and CMS Choice        Discharge Placement                       Discharge Plan and Services                DME Arranged: 3-N-1 DME Agency: [employer] Date DME Agency Contacted: 11/01/18 Time DME Agency Contacted: 61 Representative spoke with at DME Agency: [place] ([place] Interventions     Readmission Risk Interventions No flowsheet data found.

## 2018-11-01 NOTE — Evaluation (Signed)
Physical Therapy Evaluation Patient Details Name: Juan Sampson MRN: [id number] DOB: 02/16/1955 Today's Date: 11/01/2018   History of Present Illness  Juan Sampson is a 63 y/o male admitted s/p PLIF L4-5, DLL L3-4  Clinical Impression   Patient received in chair, very pleasant and motivated to participate with PT. Able to complete functional transfers with mod(I) and VC for mechanics to maintain back precautions, also tolerated gait training approximately 5106f with mod(I) in hallway and S for safety, no significant gait impairments or balance deficits noted. RN reports he has been doing well and has actually been walking regularly in hallway with S from nursing staff. Verbally reviewed back precautions, also educated on need for 3 in 1 to elevate toilet to assist in maintaining back precautions and safety at home. Practiced transfers to maintain back precautions. He was left up in the chair with all needs met, family present, RN aware of patient status; RN reports patient discharging home today. PT signing off as patient is at mod(I) level and all education completed, also patient discharging today- thank you for the referral.     Follow Up Recommendations No PT follow up    Equipment Recommendations  3in1 (PT)    Recommendations for Other Services       Precautions / Restrictions Precautions Precautions: None;Back Precaution Booklet Issued: No Precaution Comments: Pt able to recall Required Braces or Orthoses: Spinal Brace Spinal Brace: Lumbar corset;Applied in sitting position Restrictions Weight Bearing Restrictions: No Other Position/Activity Restrictions: ok to remove brace in bed; ok to amb to BR without brace; ok to remove to shower      Mobility  Bed Mobility Overal bed mobility: Modified Independent             General bed mobility comments: OOB in chair, Mod(I) per OT report  Transfers Overall transfer level: Modified independent Equipment used: None              General transfer comment: no phsyical assist needed, no LOB  Ambulation/Gait Ambulation/Gait assistance: Modified independent (Device/Increase time) Gait Distance (Feet): 500 Feet Assistive device: None Gait Pattern/deviations: WFL(Within Functional Limits);Step-through pattern Gait velocity: decreased   General Gait Details: gait pattern WNL, no LOB or unsteadiness noted  Stairs            Wheelchair Mobility    Modified Rankin (Stroke Patients Only)       Balance Overall balance assessment: Mild deficits observed, not formally tested                                           Pertinent Vitals/Pain Pain Assessment: Faces Faces Pain Scale: Hurts a little bit Pain Location: back with mobility Pain Descriptors / Indicators: Aching;Sore Pain Intervention(s): Limited activity within patient's tolerance;Monitored during session;Repositioned    Home Living Family/patient expects to be discharged to:: Private residence Living Arrangements: Spouse/significant other Available Help at Discharge: Family Type of Home: House Home Access: Stairs to enter   [employer] Steps: 1 STE Home Layout: Two level;Able to live on main level with bedroom/bathroom Home Equipment: None      Prior Function Level of Independence: Independent               Hand Dominance        Extremity/Trunk Assessment   Upper Extremity Assessment Upper Extremity Assessment: Defer to OT evaluation    Lower Extremity Assessment  Lower Extremity Assessment: Overall WFL for tasks assessed    Cervical / Trunk Assessment Cervical / Trunk Assessment: Normal Cervical / Trunk Exceptions: recent spinal sx  Communication   Communication: No difficulties  Cognition Arousal/Alertness: Awake/alert Behavior During Therapy: WFL for tasks assessed/performed Overall Cognitive Status: Within Functional Limits for tasks assessed                                         General Comments      Exercises     Assessment/Plan    PT Assessment Patent does not need any further PT services  PT Problem List         PT Treatment Interventions      PT Goals (Current goals can be found in the Care Plan section)  Acute Rehab PT Goals Patient Stated Goal: to go home today PT Goal Formulation: With patient Time For Goal Achievement: 11/15/18 Potential to Achieve Goals: Good    Frequency     Barriers to discharge        Co-evaluation               AM-PAC PT "6 Clicks" Mobility  Outcome Measure Help needed turning from your back to your side while in a flat bed without using bedrails?: None Help needed moving from lying on your back to sitting on the side of a flat bed without using bedrails?: None Help needed moving to and from a bed to a chair (including a wheelchair)?: None Help needed standing up from a chair using your arms (e.g., wheelchair or bedside chair)?: None Help needed to walk in hospital room?: None Help needed climbing 3-5 steps with a railing? : A Little 6 Click Score: 23    End of Session Equipment Utilized During Treatment: Back brace Activity Tolerance: Patient tolerated treatment well Patient left: in chair;with call bell/phone within reach;with family/visitor present Nurse Communication: Mobility status;Other (comment)(need for 3 in 1 for home) PT Visit Diagnosis: Pain Pain - Right/Left: (back pain) Pain - part of body: (back pain)    Time: 7564-3329 PT Time Calculation (min) (ACUTE ONLY): 14 min   Charges:   PT Evaluation $PT Eval Low Complexity: 1 Low         Deniece Ree PT, DPT, CBIS  Supplemental Physical Therapist [place]    Pager [phone] Acute Rehab Office [phone]

## 2018-11-01 NOTE — Discharge Summary (Signed)
Physician Discharge Summary  Patient ID: Juan Sampson MRN: [id number] DOB/AGE: [birth year] 63 y.o.  Admit date: 10/31/2018 Discharge date: 11/01/2018  Admission Diagnoses: spondylolisthesis    Discharge Diagnoses: same   Discharged Condition: good  Hospital Course: The patient was admitted on 10/31/2018 and taken to the operating room where the patient underwent PLIF. The patient tolerated the procedure well and was taken to the recovery room and then to the floor in stable condition. The hospital course was routine. There were no complications. The wound remained clean dry and intact. Pt had appropriate back soreness. No complaints of leg pain or new N/T/W. The patient remained afebrile with stable vital signs, and tolerated a regular diet. The patient continued to increase activities, and pain was well controlled with oral pain medications.   Consults: None  Significant Diagnostic Studies:  Results for orders placed or performed during the hospital encounter of 10/28/18  Novel Coronavirus, NAA (Hosp order, Send-out to Ref Lab; TAT 18-24 hrs   Specimen: Nasopharyngeal Swab; Respiratory  Result Value Ref Range   SARS-CoV-2, NAA NOT DETECTED NOT DETECTED   Coronavirus Source NASOPHARYNGEAL     Chest 2 View  Result Date: 10/28/2018 CLINICAL DATA:  Preoperative, lumbar surgery EXAM: CHEST - 2 VIEW COMPARISON:  None. FINDINGS: The heart size and mediastinal contours are within normal limits. Both lungs are clear. The visualized skeletal structures are unremarkable. IMPRESSION: No acute abnormality of the lungs. Electronically Signed   By: Eddie Candle M.D.   On: 10/28/2018 10:09   Dg Lumbar Spine 2-3 Views  Result Date: 10/31/2018 CLINICAL DATA:  Intraoperative fluoroscopic imaging provided for posterior lumbar spine fusion. EXAM: LUMBAR SPINE - 2-3 VIEW; DG C-ARM 1-60 MIN COMPARISON:  None. FINDINGS: Two submitted images show bilateral pedicle screws and short interconnecting rods  fusing L4 and L5. There is an intervertebral cage that is well centered partly maintaining the L4-L5 disc space height. The orthopedic hardware appears well positioned. IMPRESSION: Operative imaging provided for L4-L5 posterior lumbar spine fusion. Well-positioned orthopedic hardware. Electronically Signed   By: Lajean Manes M.D.   On: 10/31/2018 14:27   Dg C-arm 1-60 Min  Result Date: 10/31/2018 CLINICAL DATA:  Intraoperative fluoroscopic imaging provided for posterior lumbar spine fusion. EXAM: LUMBAR SPINE - 2-3 VIEW; DG C-ARM 1-60 MIN COMPARISON:  None. FINDINGS: Two submitted images show bilateral pedicle screws and short interconnecting rods fusing L4 and L5. There is an intervertebral cage that is well centered partly maintaining the L4-L5 disc space height. The orthopedic hardware appears well positioned. IMPRESSION: Operative imaging provided for L4-L5 posterior lumbar spine fusion. Well-positioned orthopedic hardware. Electronically Signed   By: Lajean Manes M.D.   On: 10/31/2018 14:27    Antibiotics:  Anti-infectives (From admission, onward)   Start     Dose/Rate Route Frequency Ordered Stop   10/31/18 1730  ceFAZolin (ANCEF) IVPB 2g/100 mL premix     2 g 200 mL/hr over 30 Minutes Intravenous Every 8 hours 10/31/18 1727 11/01/18 0100   10/31/18 1153  bacitracin 50,000 Units in sodium chloride 0.9 % 500 mL irrigation  Status:  Discontinued       As needed 10/31/18 1153 10/31/18 1440   10/31/18 0900  ceFAZolin (ANCEF) IVPB 2g/100 mL premix     2 g 200 mL/hr over 30 Minutes Intravenous On call to O.R. 10/31/18 0848 10/31/18 1052   10/31/18 0853  ceFAZolin (ANCEF) 2-4 GM/100ML-% IVPB    Note to Pharmacy: Merryl Hacker   : cabinet override  10/31/18 0853 10/31/18 1052      Discharge Exam: Blood pressure 124/71, pulse 63, temperature 98.2 F (36.8 C), temperature source Oral, resp. rate 18, height 5\' 11"  (1.803 m), weight 97.1 kg, SpO2 98 %. Neurologic: Grossly  normal Dressing dry  Discharge Medications:   Allergies as of 11/01/2018   No Known Allergies     Medication List    TAKE these medications   amLODipine 5 MG tablet Commonly known as: NORVASC Take 5 mg by mouth daily.   aspirin EC 81 MG tablet Take 81 mg by mouth daily.   atorvastatin 20 MG tablet Commonly known as: LIPITOR Take 20 mg by mouth daily.   celecoxib 200 MG capsule Commonly known as: CELEBREX Take 1 capsule (200 mg total) by mouth every 12 (twelve) hours.   CoQ10 100 MG Caps Take 100 mg by mouth daily.   Fish Oil 1000 MG Caps Take 1,000 mg by mouth daily.   GLUCOSAMINE CHOND COMPLEX/MSM PO Take 1-2 tablets by mouth daily.   lisinopril 20 MG tablet Commonly known as: ZESTRIL Take 20 mg by mouth daily.   methocarbamol 500 MG tablet Commonly known as: ROBAXIN Take 1 tablet (500 mg total) by mouth every 6 (six) hours as needed for muscle spasms.   omeprazole 20 MG capsule Commonly known as: PRILOSEC Take 20 mg by mouth daily.   Oxycodone HCl 10 MG Tabs Take 1 tablet (10 mg total) by mouth every 4 (four) hours as needed for severe pain ((score 7 to 10)).   Potassium 99 MG Tabs Take 99 mg by mouth daily as needed (cramping).   PROBIOTIC DIGESTIVE SUPPORT PO Take 1 capsule by mouth daily.            Durable Medical Equipment  (From admission, onward)         Start     Ordered   10/31/18 1728  DME Walker rolling  Once    Question:  Patient needs a walker to treat with the following condition  Answer:  S/P lumbar fusion   10/31/18 1727   10/31/18 1728  DME 3 n 1  Once     10/31/18 1727          Disposition: home   Final Dx: plif  Discharge Instructions     Remove dressing in 72 hours   Complete by: As directed    Call MD for:  difficulty breathing, headache or visual disturbances   Complete by: As directed    Call MD for:  persistant nausea and vomiting   Complete by: As directed    Call MD for:  redness, tenderness, or signs  of infection (pain, swelling, redness, odor or green/yellow discharge around incision site)   Complete by: As directed    Call MD for:  severe uncontrolled pain   Complete by: As directed    Call MD for:  temperature >100.4   Complete by: As directed    Diet - low sodium heart healthy   Complete by: As directed    Increase activity slowly   Complete by: As directed          Signed: Eustace Moore 11/01/2018, 8:43 AM

## 2018-11-01 NOTE — Progress Notes (Signed)
Pt and wife given d/s education and all questions answered. No printed prescriptions to give, 3 in 1 equipment delivered to room prior to D/C. IV removed. Pt taken to car with all belongings.

## 2018-11-03 ENCOUNTER — Encounter (HOSPITAL_COMMUNITY): Payer: Self-pay | Admitting: Neurological Surgery

## 2019-01-05 ENCOUNTER — Telehealth: Payer: Self-pay | Admitting: Neurology

## 2019-01-05 NOTE — Telephone Encounter (Signed)
lvm to r/s 1/12 due to MD being out

## 2019-02-10 ENCOUNTER — Ambulatory Visit: Payer: BC Managed Care – PPO | Admitting: Neurology

## 2019-02-24 ENCOUNTER — Encounter: Payer: Self-pay | Admitting: Neurology

## 2019-02-24 ENCOUNTER — Other Ambulatory Visit: Payer: Self-pay

## 2019-02-24 ENCOUNTER — Ambulatory Visit (INDEPENDENT_AMBULATORY_CARE_PROVIDER_SITE_OTHER): Payer: BLUE CROSS/BLUE SHIELD | Admitting: Neurology

## 2019-02-24 VITALS — BP 140/83 | HR 83 | Temp 97.3°F | Ht 71.0 in | Wt 224.5 lb

## 2019-02-24 DIAGNOSIS — Z9889 Other specified postprocedural states: Secondary | ICD-10-CM | POA: Diagnosis not present

## 2019-02-24 DIAGNOSIS — R253 Fasciculation: Secondary | ICD-10-CM

## 2019-02-24 DIAGNOSIS — R292 Abnormal reflex: Secondary | ICD-10-CM | POA: Diagnosis not present

## 2019-02-24 MED ORDER — GABAPENTIN 300 MG PO CAPS: 300.0000 mg | ORAL_CAPSULE | Freq: Three times a day (TID) | ORAL | 5 refills | Status: DC

## 2019-02-24 NOTE — Progress Notes (Signed)
PATIENT: Juan Sampson DOB: 01-25-1956  Chief Complaint  Patient presents with  . Nerve Pain/Muscle Spasms    Reports frequent twitching and tingling in bilateral legs, arms, chest and face.  Marland Kitchen PCP    Allie Dimmer, MD  . Neurosurgery    Meyran, Ocie Cornfield, NP - referring provider     HISTORICAL  Juan Sampson is a 64 years old male, seen in request by neurosurgeon nurse practitioner Eleonore Chiquito, and his primary care physician Dr. Allie Dimmer, for evaluation of frequent muscle twitching at bilateral lower and upper extremities.  Initial evaluation was on February 24, 2019.  I have reviewed and summarized the referring note from the referring physician.  He has past medical history of hypertension, hyperlipidemia, he underwent posterior lumbar interbody fusion L4-5 posterior L4-5 fixation decompressive laminectomy and medial facet ectomy and foraminotomy at L3-4 by Dr. Roddie Mc on October 31, 2018 which has helped his low back pain, he had his first lumbar surgery in 2012, L4-5 laminectomy, which also helped his symptoms, and low back pain  Around 2012, before he had his initial lumbar decompression surgery, he noticed intermittent muscle twitching, initially mainly involving bilateral calf muscle, later spread to lower extremity muscles, thigh area, in 2020, is also began to involving upper extremity, chest region, lower abdomen, upper extremity, it happens randomly, no pain, no muscle weakness, but over the years, is become more frequent, involving bigger area of his body, is quite annoying for him,  He denies bulbar weakness, denies limb muscle weakness.  REVIEW OF SYSTEMS: Full 14 system review of systems performed and notable only for as above All other review of systems were negative.  ALLERGIES: No Known Allergies  HOME MEDICATIONS: Current Outpatient Medications  Medication Sig Dispense Refill  . amLODipine (NORVASC) 5 MG tablet Take 5  mg by mouth daily.    Marland Kitchen aspirin EC 81 MG tablet Take 81 mg by mouth daily.    Marland Kitchen atorvastatin (LIPITOR) 20 MG tablet Take 20 mg by mouth daily.    . Coenzyme Q10 (COQ10) 100 MG CAPS Take 100 mg by mouth daily.    . Lactobacillus-Inulin (PROBIOTIC DIGESTIVE SUPPORT PO) Take 1 capsule by mouth daily.    Marland Kitchen lisinopril (ZESTRIL) 20 MG tablet Take 20 mg by mouth daily.    . Misc Natural Products (GLUCOSAMINE CHOND COMPLEX/MSM PO) Take 1-2 tablets by mouth daily.    . Potassium 99 MG TABS Take 99 mg by mouth daily as needed (cramping).     No current facility-administered medications for this visit.    PAST MEDICAL HISTORY: Past Medical History:  Diagnosis Date  . Arthritis   . Cancer ([place])    basal cell skin cancer  . Dysrhythmia    skips a beat  . History of kidney stones   . Hypertension   . Muscle twitching   . Sleep apnea    uses cpap  . Tingling     PAST SURGICAL HISTORY: Past Surgical History:  Procedure Laterality Date  . CARDIAC CATHETERIZATION    . COLONOSCOPY    . HERNIA REPAIR     umbilical  . LUMBAR LAMINECTOMY    . LUMBAR LAMINECTOMY/DECOMPRESSION MICRODISCECTOMY N/A 10/31/2018   Procedure: Decompressive laminectomy Lumbar Three-Lumbar Four;  Surgeon: Eustace Moore, MD;  Location: [facility];  Service: Neurosurgery;  Laterality: N/A;  Decompressive laminectomy Lumbar Three-Lumbar Four  . NASAL SINUS SURGERY      FAMILY HISTORY: Family History  Problem Relation  Age of Onset  . COPD Mother   . Heart disease Mother   . Uterine cancer Mother   . Kidney failure Father   . Bladder Cancer Sister   . Esophageal cancer Brother   . Cancer Maternal Grandfather        unsure of type  . Lung cancer Sister     SOCIAL HISTORY: Social History   Socioeconomic History  . Marital status: Married    Spouse name: Not on file  . Number of children: 2  . Years of education: 94  . Highest education level: High school graduate  Occupational History  . Occupation: Librarian, academic    Tobacco Use  . Smoking status: Never Smoker  . Smokeless tobacco: Never Used  Substance and Sexual Activity  . Alcohol use: Not Currently  . Drug use: Never  . Sexual activity: Not on file  Other Topics Concern  . Not on file  Social History Narrative   Lives at home with wife.   Right-handed.   One cup caffeine per day.   Social Determinants of Health   Financial Resource Strain:   . Difficulty of Paying Living Expenses: Not on file  Food Insecurity:   . Worried About Charity fundraiser in the Last Year: Not on file  . Ran Out of Food in the Last Year: Not on file  Transportation Needs:   . Lack of Transportation (Medical): Not on file  . Lack of Transportation (Non-Medical): Not on file  Physical Activity:   . Days of Exercise per Week: Not on file  . Minutes of Exercise per Session: Not on file  Stress:   . Feeling of Stress : Not on file  Social Connections:   . Frequency of Communication with Friends and Family: Not on file  . Frequency of Social Gatherings with Friends and Family: Not on file  . Attends Religious Services: Not on file  . Active Member of Clubs or Organizations: Not on file  . Attends Archivist Meetings: Not on file  . Marital Status: Not on file  Intimate Partner Violence:   . Fear of Current or Ex-Partner: Not on file  . Emotionally Abused: Not on file  . Physically Abused: Not on file  . Sexually Abused: Not on file     PHYSICAL EXAM   Vitals:   02/24/19 0820  BP: 140/83  Pulse: 83  Temp: (!) 97.3 F (36.3 C)  Weight: 224 lb 8 oz (101.8 kg)  Height: 5\' 11"  (1.803 m)    Not recorded      Body mass index is 31.31 kg/m.  PHYSICAL EXAMNIATION:  Gen: NAD, conversant, well nourised, well groomed                     Cardiovascular: Regular rate rhythm, no peripheral edema, warm, nontender. Eyes: Conjunctivae clear without exudates or hemorrhage Neck: Supple, no carotid bruits. Pulmonary: Clear to auscultation  bilaterally   NEUROLOGICAL EXAM:  MENTAL STATUS: Speech:    Speech is normal; fluent and spontaneous with normal comprehension.  Cognition:     Orientation to time, place and person     Normal recent and remote memory     Normal Attention span and concentration     Normal Language, naming, repeating,spontaneous speech     Fund of knowledge   CRANIAL NERVES: CN II: Visual fields are full to confrontation. Pupils are round equal and briskly reactive to light. CN III, IV, VI: extraocular movement  are normal. No ptosis. CN V: Facial sensation is intact to light touch CN VII: Face is symmetric with normal eye closure  CN VIII: Hearing is normal to causal conversation. CN IX, X: Phonation is normal. CN XI: Head turning and shoulder shrug are intact  MOTOR: Frequent muscle fasciculation was noted at bilateral calf, upper extremity muscles, there is no bilateral upper extremity, bilateral lower extremity proximal muscle weakness, he has mild right toe extension weakness.  REFLEXES: Reflexes are 3 and symmetric at the biceps, triceps, knees, and absent at ankles. Plantar responses are flexor bilaterally  SENSORY: Decreased light touch, vibratory sensation at toes to ankle level  COORDINATION: There is no trunk or limb dysmetria noted.  GAIT/STANCE: Posture is normal. Gait is steady with normal steps, base, arm swing, and turning.  He has mild difficulty perform right heel walking, bilateral tiptoe walking was normal   DIAGNOSTIC DATA (LABS, IMAGING, TESTING) - I reviewed patient records, labs, notes, testing and imaging myself where available.   ASSESSMENT AND PLAN  Riston Schuld is a 64 y.o. male    Muscle fasciculations since 2012,  Gradually getting worse, involving chest, upper extremity, bilateral lower extremity muscles,  There is no evidence of muscle weakness, atrophy,  Hyperreflexia on examinations,  MRI of cervical spine to rule out cervical spondylitic  myelopathy  EMG nerve conduction study  Laboratory evaluations from primary care physician  Gabapentin 300 mg 3 times daily for symptoms control  Marcial Pacas, M.D. Ph.D.  [facility] [address] [place], [place] [address] Ph: [phone] Fax: [phone]  CC: Eleonore Chiquito, NP, Allie Dimmer, MD

## 2019-03-11 ENCOUNTER — Other Ambulatory Visit: Payer: Self-pay

## 2019-03-11 ENCOUNTER — Ambulatory Visit (INDEPENDENT_AMBULATORY_CARE_PROVIDER_SITE_OTHER): Payer: BC Managed Care – PPO

## 2019-03-11 DIAGNOSIS — R292 Abnormal reflex: Secondary | ICD-10-CM | POA: Diagnosis not present

## 2019-03-11 DIAGNOSIS — R253 Fasciculation: Secondary | ICD-10-CM

## 2019-03-11 DIAGNOSIS — Z9889 Other specified postprocedural states: Secondary | ICD-10-CM

## 2019-03-12 ENCOUNTER — Telehealth: Payer: Self-pay | Admitting: Neurology

## 2019-03-12 NOTE — Telephone Encounter (Signed)
I spoke to the patient and provided him with his cervical MRI results. He verbalized understanding. He has a prescription for gabapentin 300mg  capsules, one TID. He became so drowsy early evening when taking one capsule, that he has not tried it during the day yet. States the medication, even at the low dose, was helpful. He is going to try two capsules at bedtime and attempt a daytime dose during the weekend when he does not have to work. His plan is to work his way up to taking it as prescribed. I have reviewed this information with Dr. Krista Blue. The patient is aware to call us back with any concerns. He will keep his pending NCV/EMG on 03/30/2019.

## 2019-03-12 NOTE — Telephone Encounter (Signed)
Please call patient, MRI of cervical spine showed multilevel degenerative disc disease, most severe at C4-5, C5-6, with moderate right foraminal stenosis, there is no evidence of nerve root compression, or spinal cord compression  Above findings would not explain his muscle fasciculations, also check to see if the gabapentin has helped his symptoms    IMPRESSION:   MRI cervical spine (without) demonstrating: - At C4-5 right uncovertebral joint hypertrophy with moderate right foraminal stenosis. - At C5-6 uncovertebral joint hypertrophy and facet hypertrophy with moderate right and mild left  l stenosis.

## 2019-03-30 ENCOUNTER — Encounter: Payer: BLUE CROSS/BLUE SHIELD | Admitting: Neurology

## 2019-03-30 ENCOUNTER — Ambulatory Visit (INDEPENDENT_AMBULATORY_CARE_PROVIDER_SITE_OTHER): Payer: BC Managed Care – PPO | Admitting: Neurology

## 2019-03-30 ENCOUNTER — Other Ambulatory Visit: Payer: Self-pay

## 2019-03-30 DIAGNOSIS — Z0289 Encounter for other administrative examinations: Secondary | ICD-10-CM

## 2019-03-30 DIAGNOSIS — Z9889 Other specified postprocedural states: Secondary | ICD-10-CM

## 2019-03-30 DIAGNOSIS — R253 Fasciculation: Secondary | ICD-10-CM | POA: Diagnosis not present

## 2019-03-30 DIAGNOSIS — R292 Abnormal reflex: Secondary | ICD-10-CM

## 2019-03-30 DIAGNOSIS — G5601 Carpal tunnel syndrome, right upper limb: Secondary | ICD-10-CM | POA: Insufficient documentation

## 2019-03-30 MED ORDER — GABAPENTIN 300 MG PO CAPS: 300.0000 mg | ORAL_CAPSULE | Freq: Three times a day (TID) | ORAL | 4 refills | Status: DC

## 2019-03-30 NOTE — Procedures (Signed)
Full Name: Juan Sampson Gender: Male MRN #: [id number] Date of Birth: [birth year]    Visit Date: 03/30/2019 10:04 Age: 64 Years Examining Physician: Marcial Pacas, MD  Referring Physician: Marcial Pacas, MD History:   64 year old male with history of frequent muscle fasciculations.  Summary of the tests:  Nerve conduction study:  Right median sensory response showed borderline prolonged peak latency, right median motor responses showed mildly prolonged distal latency, was otherwise normal.  Right ulnar sensory and motor responses were normal.  Right sural, superficial peroneal sensory responses were normal.  Right peroneal to EDB and tibial motor responses were normal.  Electromyography:  Selected needle examination of the right upper and lower extremity muscles were normal, with exception of occasionally noted muscle fasciculation at right medial gastrocnemius.  Conclusion: This is a mild abnormal study.  There is electrodiagnostic evidence of right median neuropathy across the wrist, consistent with mild to moderate right carpal tunnel syndrome,   ------------------------------- Marcial Pacas, M.D. PhD  [facility] [place], [place] [address] Tel: [phone] Fax: [phone]         [facility]    Nerve / Sites Muscle Latency Ref. Amplitude Ref. Rel Amp Segments Distance Velocity Ref. Area    ms ms mV mV %  cm m/s m/s mVms  R Median - APB     Wrist APB 4.8 ?4.4 8.3 ?4.0 100 Wrist - APB 7   34.6     Upper arm APB 9.5  8.4  102 Upper arm - Wrist 23 49 ?49 37.5  R Ulnar - ADM     Wrist ADM 3.2 ?3.3 8.6 ?6.0 100 Wrist - ADM 7   33.4     B.Elbow ADM 7.6  5.4  62.1 B.Elbow - Wrist 22 50 ?49 25.0     A.Elbow ADM 9.6  4.3  81 A.Elbow - B.Elbow 10 49 ?49 22.6         A.Elbow - Wrist      R Peroneal - EDB     Ankle EDB 5.6 ?6.5 3.4 ?2.0 100 Ankle - EDB 9   12.4     Fib head EDB 12.6  3.3  97.3 Fib head - Ankle 29 41 ?44 12.6     Pop fossa EDB 15.1   3.1  93.9 Pop fossa - Fib head 10 41 ?44 11.9         Pop fossa - Ankle      R Tibial - AH     Ankle AH 4.5 ?5.8 4.4 ?4.0 100 Ankle - AH 9   11.7     Pop fossa AH 14.2  2.9  65.6 Pop fossa - Ankle 40 41 ?41 11.1             SNC    Nerve / Sites Rec. Site Peak Lat Ref.  Amp Ref. Segments Distance    ms ms V V  cm  R Radial - Anatomical snuff box (Forearm)     Forearm Wrist 2.9 ?2.9 23 ?15 Forearm - Wrist 10  R Sural - Ankle (Calf)     Calf Ankle 3.8 ?4.4 7 ?6 Calf - Ankle 14  R Superficial peroneal - Ankle     Lat leg Ankle 3.6 ?4.4 5 ?6 Lat leg - Ankle 14  R Median - Orthodromic (Dig II, Mid palm)     Dig II Wrist 3.4 ?3.4 11 ?10 Dig II - Wrist 13  R Ulnar -  Orthodromic, (Dig V, Mid palm)     Dig V Wrist 2.9 ?3.1 6 ?5 Dig V - Wrist 46               F  Wave    Nerve F Lat Ref.   ms ms  R Tibial - AH 55.0 ?56.0  R Ulnar - ADM 31.9 ?32.0         EMG Summary Table    Spontaneous MUAP Recruitment  Muscle IA Fib PSW Fasc Other Amp Dur. Poly Pattern  R. First dorsal interosseous Normal None None None _______ Normal Normal Normal Normal  R. Pronator teres Normal None None None _______ Normal Normal Normal Normal  R. Biceps brachii Normal None None None _______ Normal Normal Normal Normal  R. Deltoid Normal None None None _______ Normal Normal Normal Normal  R. Extensor digitorum communis Normal None None None _______ Normal Normal Normal Normal  R. Tibialis anterior Normal None None None _______ Normal Normal Normal Normal  R. Peroneus longus Normal None None None _______ Normal Normal Normal Normal  R. Gastrocnemius (Medial head) Normal faciculation None None _______ Normal Normal Normal Normal

## 2019-03-31 NOTE — Progress Notes (Signed)
EMG nerve conduction study report is on the procedure tablets

## 2019-04-13 ENCOUNTER — Telehealth: Payer: Self-pay | Admitting: Neurology

## 2019-04-13 MED ORDER — GABAPENTIN 300 MG PO CAPS: ORAL_CAPSULE | ORAL | 11 refills | Status: AC

## 2019-04-13 MED ORDER — GABAPENTIN (ONCE-DAILY) 300 MG PO TABS: 600.0000 mg | ORAL_TABLET | Freq: Three times a day (TID) | ORAL | 11 refills | Status: DC

## 2019-04-13 NOTE — Addendum Note (Signed)
Addended by: Marcial Pacas on: 04/13/2019 02:52 PM   Modules accepted: Orders

## 2019-04-13 NOTE — Addendum Note (Signed)
Addended by: Desmond Lope on: 04/13/2019 04:44 PM   Modules accepted: Orders

## 2019-04-13 NOTE — Telephone Encounter (Signed)
I have increased his gabapentin to 300mg  tablets, he can take 1 and 1/2 tablets to 2 tablets tid

## 2019-04-13 NOTE — Telephone Encounter (Signed)
Pt called to discuss his gabapentin dosage .

## 2019-04-13 NOTE — Telephone Encounter (Signed)
Per vo by Dr. Krista Blue, she is actually going to send in gabapentin 300mg  capsules, 1-2 caps TID. The previously sent prescription has been voided at the [facility].

## 2019-04-13 NOTE — Telephone Encounter (Signed)
I spoke to the patient who confirmed he is taking gabapentin 300mg , one capsule TID for his muscle fasciculations. His symptoms are no longer being well controlled at this dose. He is not having any issues w/ drowsiness due the medication and would like to try a higher dosage.

## 2019-04-13 NOTE — Addendum Note (Signed)
Addended by: Marcial Pacas on: 04/13/2019 04:50 PM   Modules accepted: Orders

## 2019-10-23 ENCOUNTER — Other Ambulatory Visit: Payer: Self-pay | Admitting: Neurology

## 2019-10-23 DIAGNOSIS — R292 Abnormal reflex: Secondary | ICD-10-CM

## 2019-10-23 DIAGNOSIS — R253 Fasciculation: Secondary | ICD-10-CM

## 2020-03-30 IMAGING — CR DG CHEST 2V
2 series · 2 of 2 positions shown · non-contrast
Comparison: None.

CLINICAL DATA: Preoperative, lumbar surgery

EXAM:
CHEST - 2 VIEW

[w chest pa]
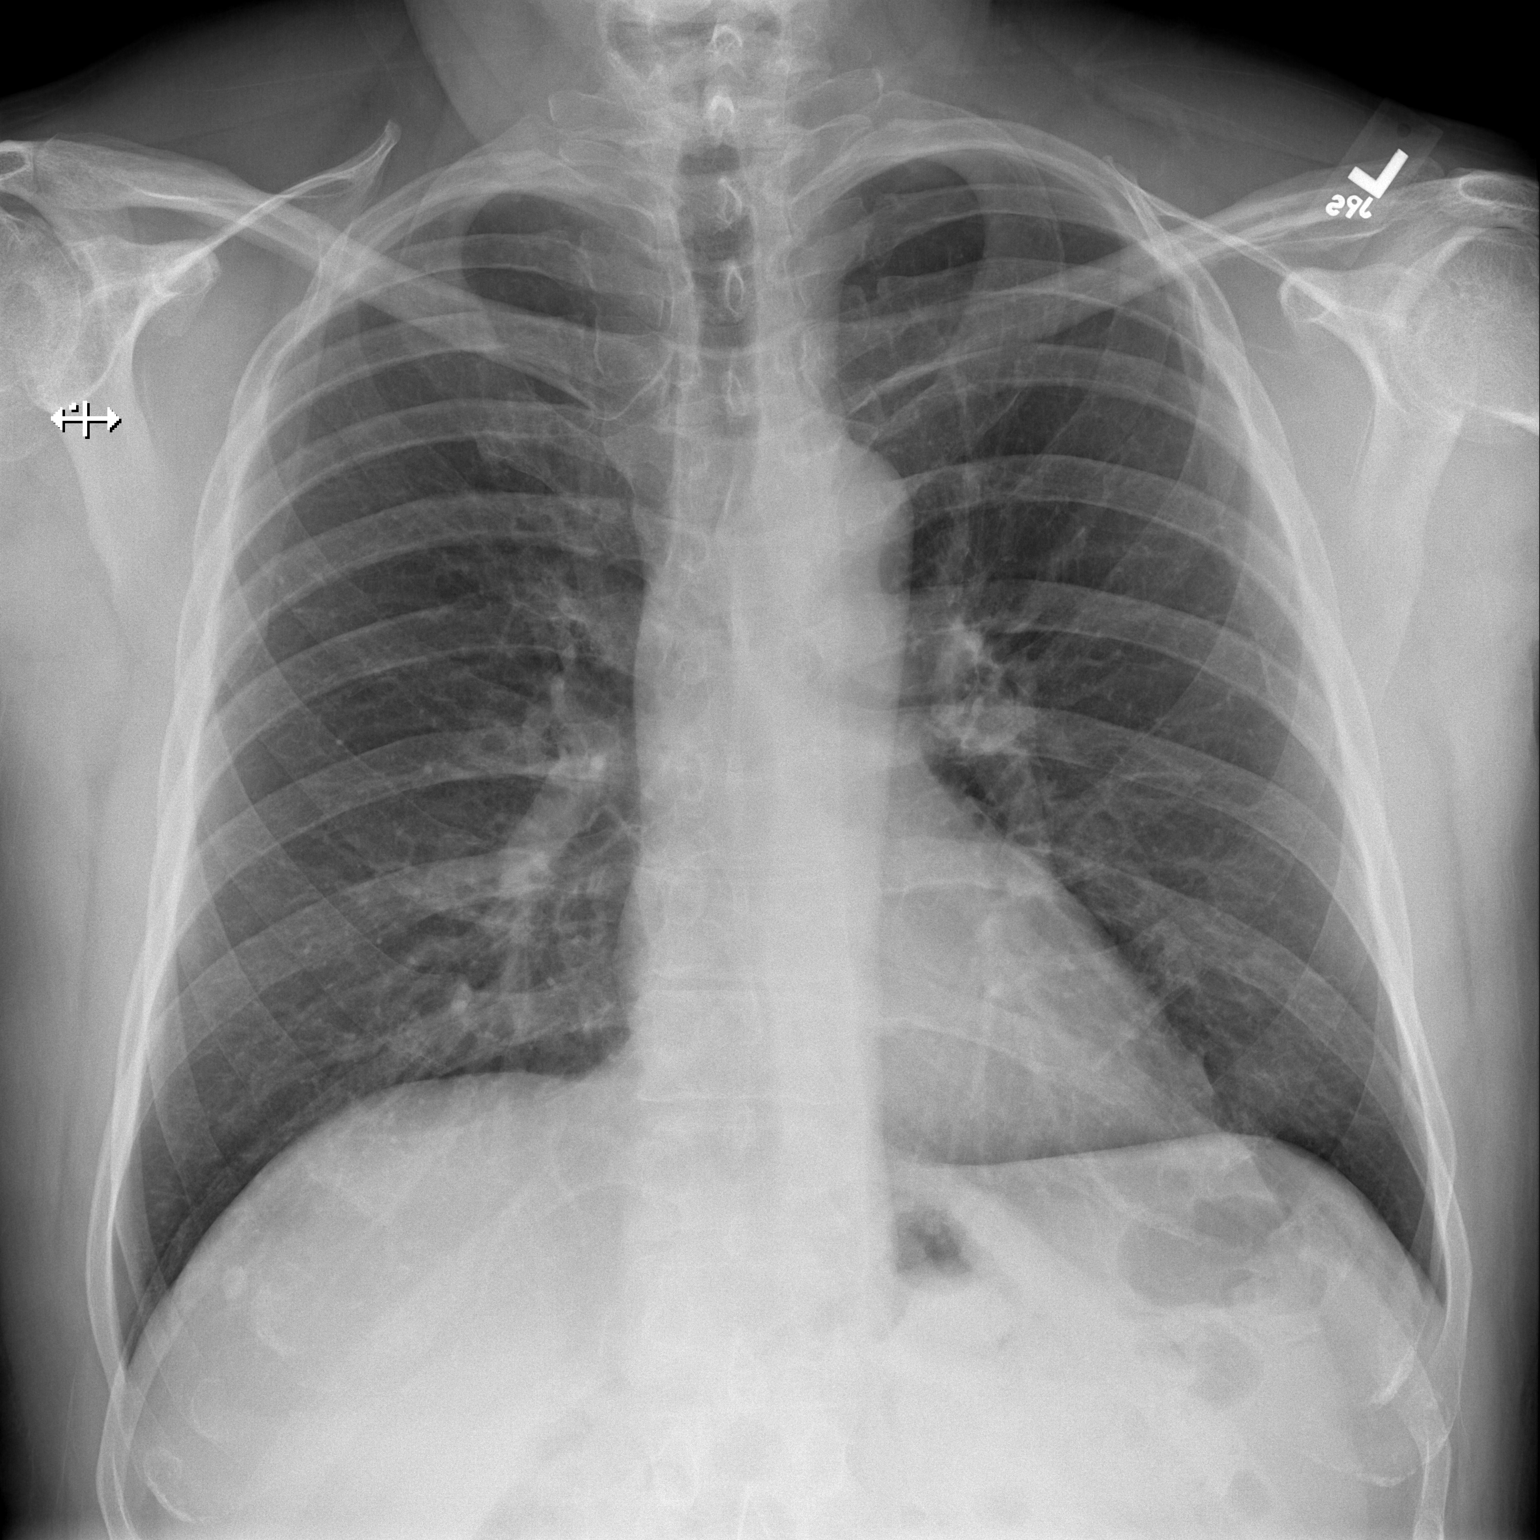

[w chest lat]
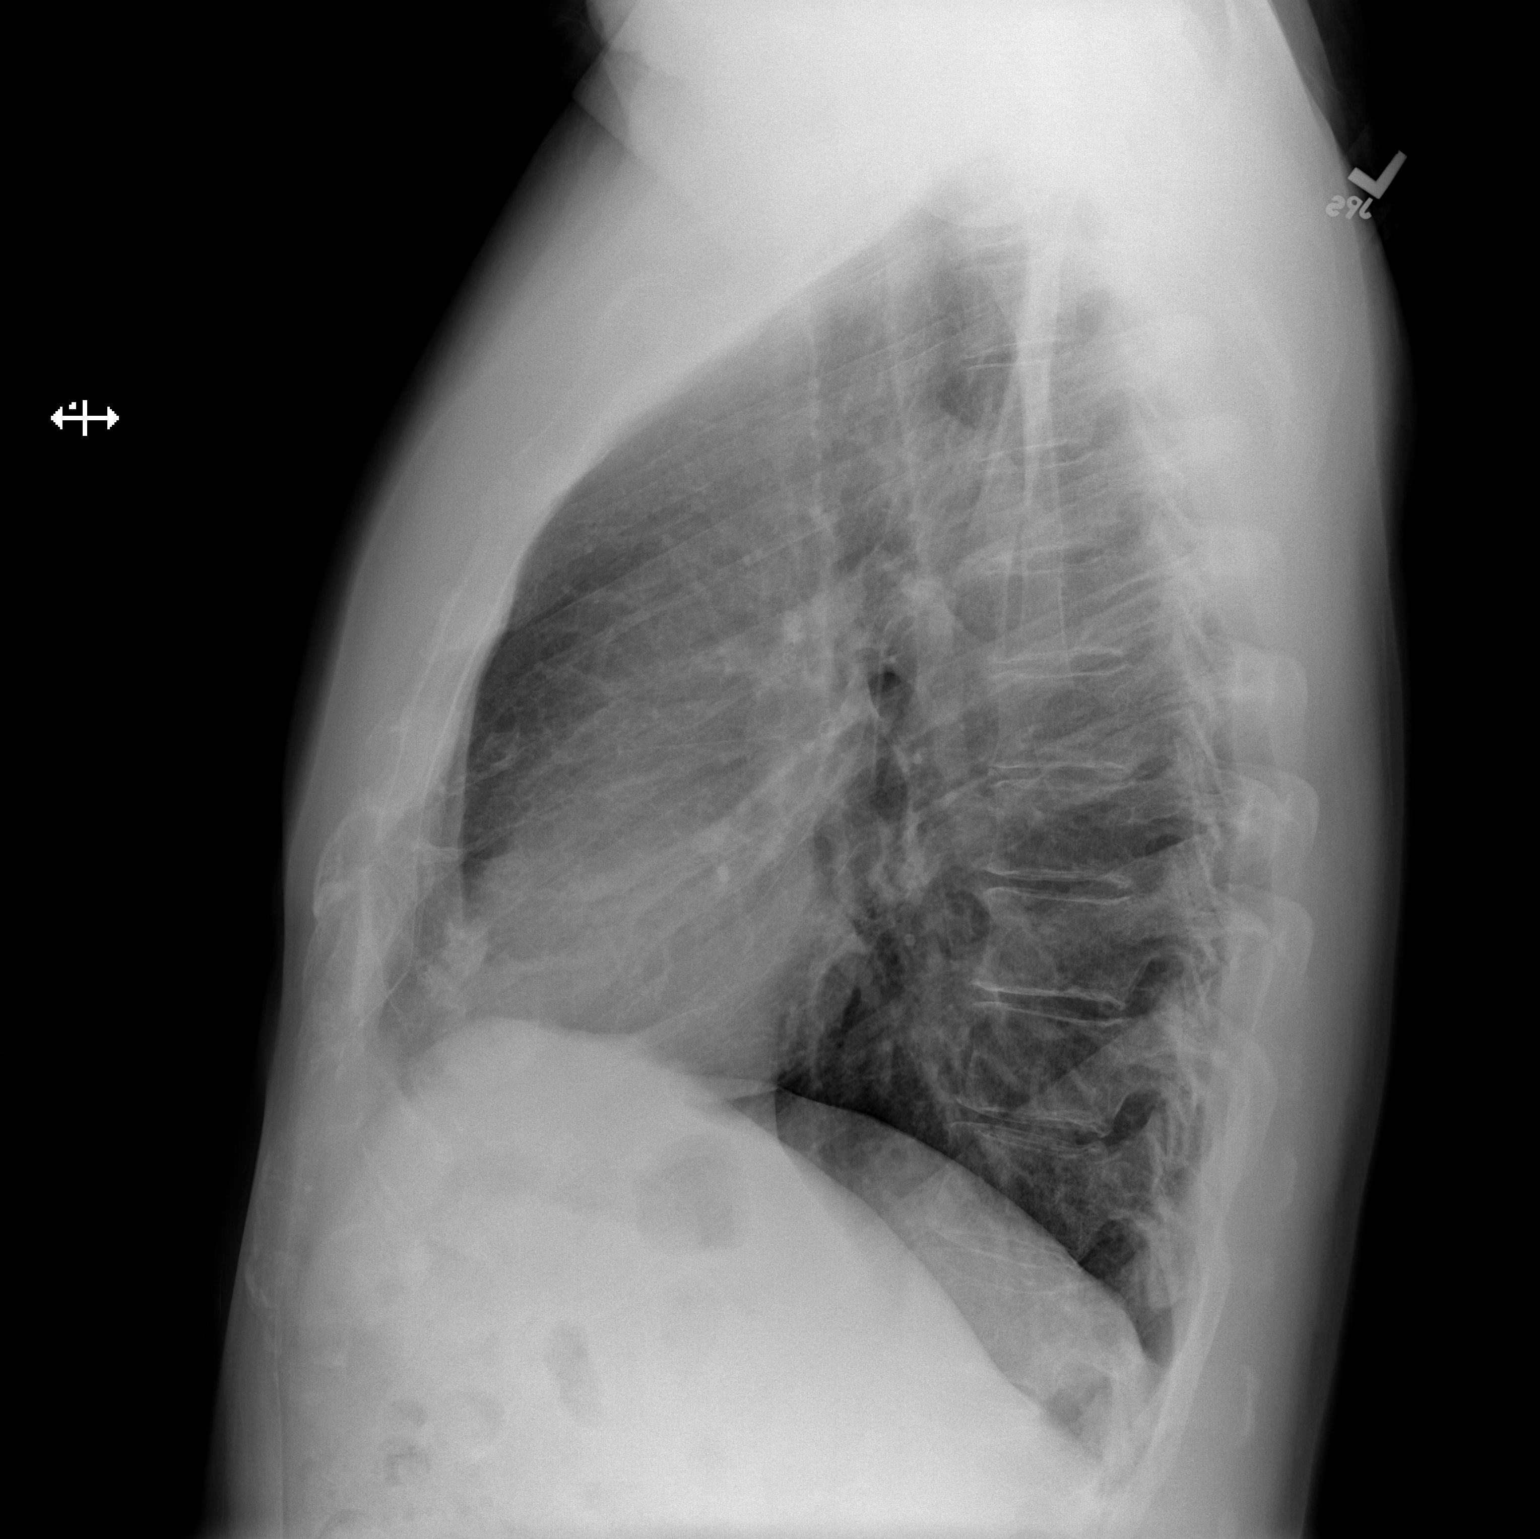

[2 of 2 positions shown; findings below may reference images not displayed]

FINDINGS: The heart size and mediastinal contours are within normal limits.
Both lungs are clear. The visualized skeletal structures are
unremarkable.
IMPRESSION: No acute abnormality of the lungs.

## 2020-04-02 IMAGING — RF DG C-ARM 1-60 MIN
1 series · 2 of 2 positions shown · non-contrast
Comparison: None.

CLINICAL DATA: Intraoperative fluoroscopic imaging provided for
posterior lumbar spine fusion.

EXAM:
LUMBAR SPINE - 2-3 VIEW; DG C-ARM 1-60 MIN

[Series 1: run · 2 of 2 slices shown]
[im 1/2]
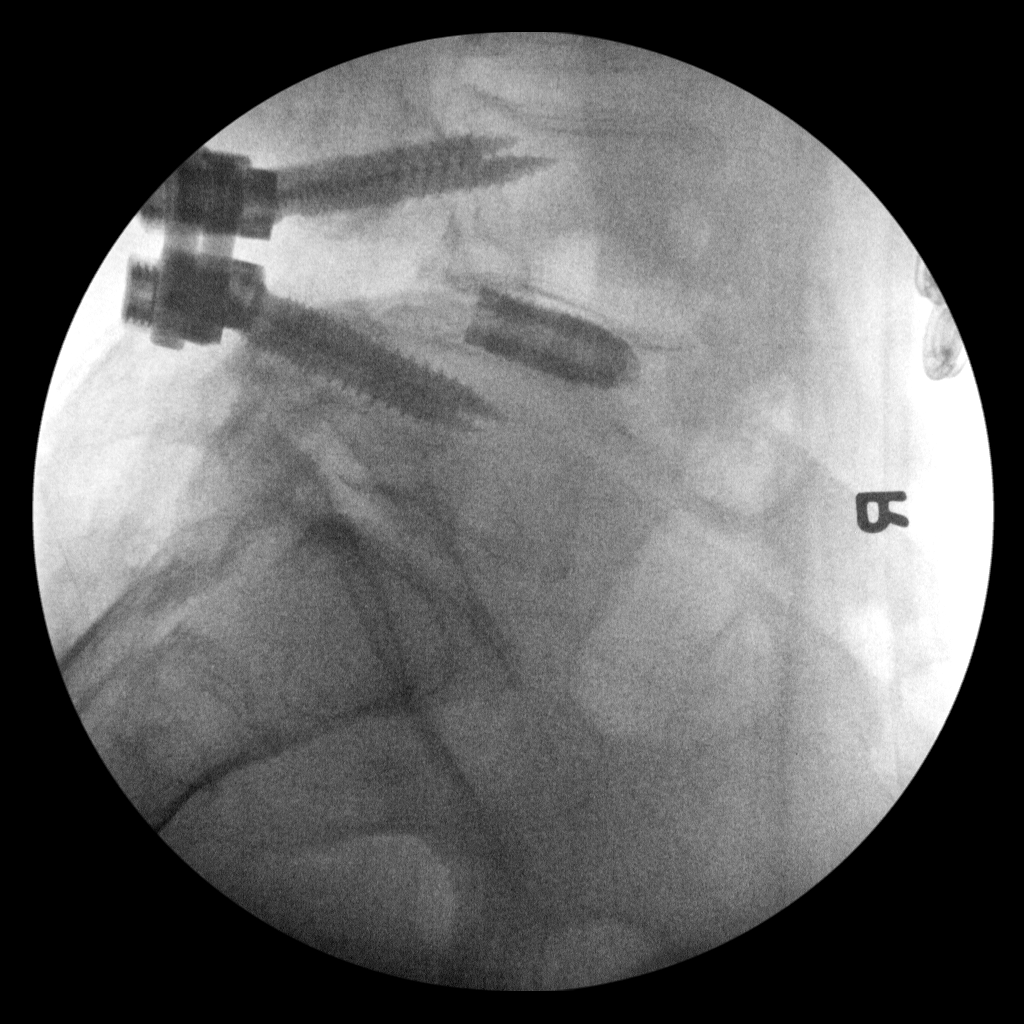
[im 2/2]
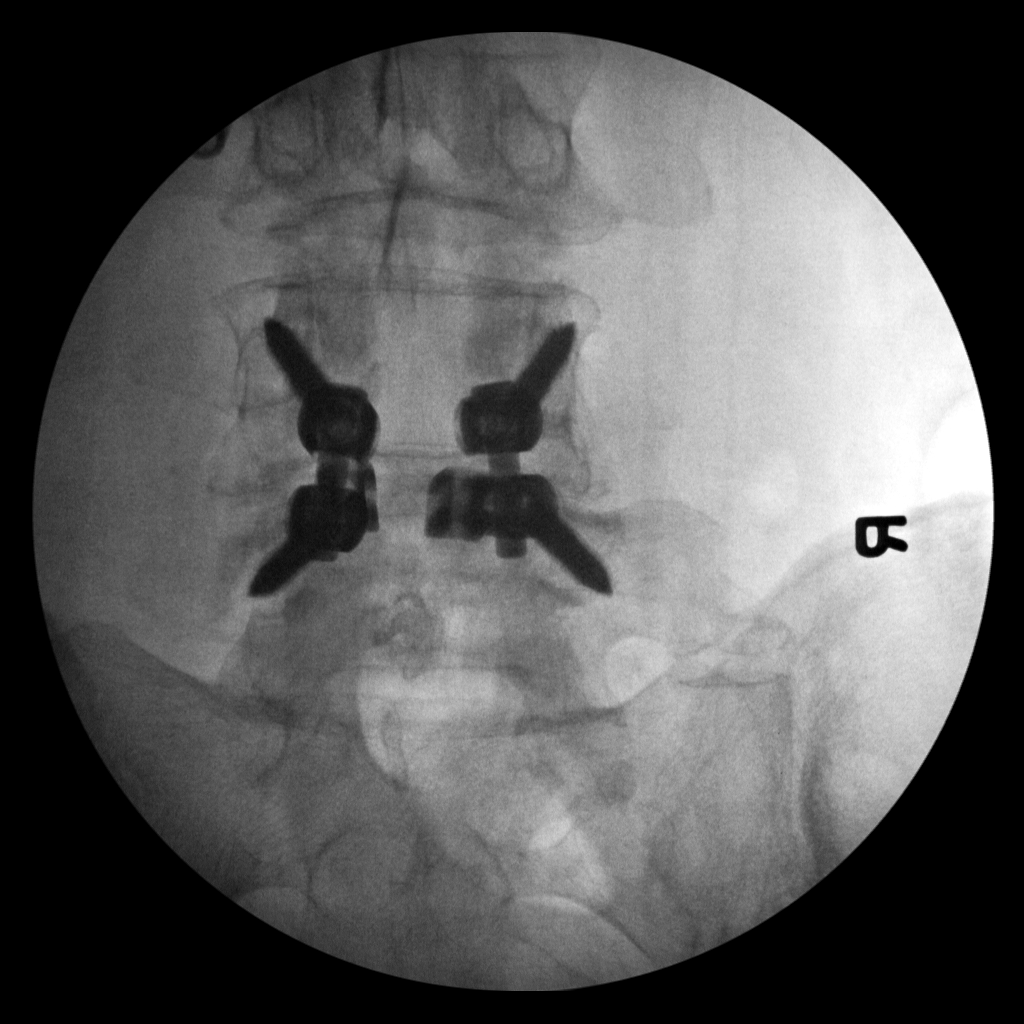

[2 of 2 positions shown; findings below may reference images not displayed]

FINDINGS: Two submitted images show bilateral pedicle screws and short
interconnecting rods fusing L4 and L5. There is an intervertebral
cage that is well centered partly maintaining the L4-L5 disc space
height. The orthopedic hardware appears well positioned.
IMPRESSION: Operative imaging provided for L4-L5 posterior lumbar spine fusion.
Well-positioned orthopedic hardware.
